# Patient Record
Sex: Male | Born: 1980 | Race: White | Hispanic: No | Marital: Married | Smoking: Never smoker
Health system: Southern US, Community
[De-identification: ages and names within clinical notes are randomized; demographics above are authoritative.]

## PROBLEM LIST (undated history)

## (undated) DIAGNOSIS — N529 Male erectile dysfunction, unspecified: Secondary | ICD-10-CM

## (undated) HISTORY — PX: TONSILLECTOMY AND ADENOIDECTOMY: SUR1326

## (undated) HISTORY — DX: Male erectile dysfunction, unspecified: N52.9

---

## 2010-07-22 ENCOUNTER — Ambulatory Visit: Payer: Self-pay | Admitting: Internal Medicine

## 2010-07-22 DIAGNOSIS — D239 Other benign neoplasm of skin, unspecified: Secondary | ICD-10-CM | POA: Insufficient documentation

## 2010-07-22 DIAGNOSIS — J309 Allergic rhinitis, unspecified: Secondary | ICD-10-CM | POA: Insufficient documentation

## 2010-07-22 DIAGNOSIS — N529 Male erectile dysfunction, unspecified: Secondary | ICD-10-CM | POA: Insufficient documentation

## 2010-07-22 DIAGNOSIS — F528 Other sexual dysfunction not due to a substance or known physiological condition: Secondary | ICD-10-CM

## 2010-07-23 LAB — CONVERTED CEMR LAB
BUN: 11 mg/dL (ref 6–23)
Calcium: 9.6 mg/dL (ref 8.4–10.5)
Cholesterol: 153 mg/dL (ref 0–200)
Creatinine, Ser: 1.1 mg/dL (ref 0.4–1.5)
GFR calc non Af Amer: 80.13 mL/min (ref >60)
HDL: 41.6 mg/dL (ref >39.00)
LDL Cholesterol: 103 mg/dL — ABNORMAL HIGH (ref 0–99)
TSH: 0.91 microintl units/mL (ref 0.35–5.50)
Total CHOL/HDL Ratio: 4
Triglycerides: 44 mg/dL (ref 0.0–149.0)
VLDL: 8.8 mg/dL (ref 0.0–40.0)

## 2010-08-23 ENCOUNTER — Ambulatory Visit: Payer: Self-pay | Admitting: Internal Medicine

## 2010-11-12 ENCOUNTER — Ambulatory Visit
Admission: RE | Admit: 2010-11-12 | Discharge: 2010-11-12 | Payer: Self-pay | Source: Home / Self Care | Attending: Family Medicine | Admitting: Family Medicine

## 2010-12-07 NOTE — Assessment & Plan Note (Signed)
Summary: ROA FOR 4-6 WEEK FOLLOW-UP/JRR   Vital Signs:  Patient profile:   30 year old male Weight:      198.75 pounds Temp:     97.8 degrees F oral Pulse rate:   64 / minute Pulse rhythm:   regular BP sitting:   110 / 62  (left arm) Cuff size:   large  Vitals Entered By: Selena Batten Dance CMA Duncan Dull) (August 23, 2010 8:15 AM) CC: Follow up   History of Present Illness: CC: f/u ED  using viagra for every single time.  Using 50mg  and doing well.  Had sex once without viagra, able to maintain erection but unable to ejaculate.  Now trying to taper down viagra, taking 25mg  and finding 1/3 times not sufficient.  Still able to have erection in AM.  No stress in relationship with wife.  No depression/anxiety, no decreased libido.  Energy level ok.   Allergies: No Known Drug Allergies  Past History:  Past medical, surgical, family and social histories (including risk factors) reviewed for relevance to current acute and chronic problems.  Past Medical History: Reviewed history from 07/22/2010 and no changes required. Allergic rhinitis ED?  Past Surgical History: Reviewed history from 07/22/2010 and no changes required. T&A as child  Family History: Reviewed history from 07/22/2010 and no changes required. PGM - D some CA MGM - D throat CA Maunt - D liver CA  No CAD/MI, CVA, no other CA (no prostate, colon, lung), DM, HTN, HLD  Social History: Reviewed history from 07/22/2010 and no changes required. 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   Viagra working well for him, will continue to try and taper off.  provided with refill.  RTC 2 month for f/u.  Doubt organic, likely psychogenic as evidenced by normal nocturnal erections.  States good relationship with wife, no depression, change in libido.  If not improved, consider further evaluation (ie T, relationship status, etc.)  RTC 1 mo for f/u  His updated medication list for this problem includes:    Viagra 100 Mg Tabs (Sildenafil citrate) .Marland Kitchen... Take one by mouth 30 min prior to relations  Complete Medication List: 1)  Fexofenadine Hcl 180 Mg Tabs (Fexofenadine hcl) .Marland Kitchen.. 1 by mouth once daily 2)  Rhinocort Aqua 32 Mcg/act Susp (Budesonide) .... 2 sprays in each nostril once daily 3)  Multivitamins Tabs (Multiple vitamin) .Marland Kitchen.. 1 by mouth once daily 4)  Viagra 100 Mg Tabs (Sildenafil citrate) .... Take one by mouth 30 min prior to relations  Patient Instructions: 1)  Let's continue this plan, refilled viagra. 2)  Return in 2-3 months for f/u.  Sooner if needed. 3)  Continue to use as needed, try to slowly back off if able.   Prescriptions: VIAGRA 100 MG TABS (SILDENAFIL CITRATE) take one by mouth 30 min prior to relations  #20 x 0   Entered and Authorized by:   Eustaquio Boyden  MD   Signed by:   Eustaquio Boyden  MD on 08/23/2010   Method used:   [facility]        [facility]  [address] [phone][facility] (retail)  [address]       [place], [place]  [address]       Ph: [phone] or [phone]       Fax: [phone]   RxID:   [id number]    Orders Added: 1)  Est. Patient Level III [87564]    Current Allergies (reviewed today): No known allergies

## 2010-12-07 NOTE — Assessment & Plan Note (Signed)
Summary: NEW PT TO EST/CPX/CLE   Vital Signs:  Patient profile:   30 year old male Height:      77 inches Weight:      196.50 pounds BMI:     23.39 Temp:     98.5 degrees F oral Pulse rate:   60 / minute Pulse rhythm:   regular BP sitting:   124 / 80  (left arm) Cuff size:   regular  Vitals Entered By: Selena Batten Dance CMA Duncan Dull) (July 22, 2010 9:36 AM) CC: New patient to establish care   History of Present Illness: CC: new patient.  Just had baby girl, Arna Medici.  05/2010.  ED - trouble maintaining erection x 6-8 mo.  Able to ejaculate with masturbation.  Feels desire still there, libido fine.  No issues with depression/anxiety.  No weight changes.  Able to wake up with firm erection in am.  Wife recently pregnant, didnt' have sex during pregnancy.  This week wife's OB released her to be able to have sex again.  Allergic rhinitis - takes allegra and rhinocort daily for seasonal allergies  would like spots checked on back.  Preventive Screening-Counseling & Management      Blood Transfusions:  no.    -  Date:  06/21/2010    TD booster Tdap  Current Medications (verified): 1)  Fexofenadine Hcl 180 Mg Tabs (Fexofenadine Hcl) .Marland Kitchen.. 1 By Mouth Once Daily 2)  Rhinocort Aqua 32 Mcg/act Susp (Budesonide) .... 2 Sprays in Each Nostril Once Daily 3)  Multivitamins  Tabs (Multiple Vitamin) .Marland Kitchen.. 1 By Mouth Once Daily  Allergies (verified): No Known Drug Allergies  Past History:  Past Medical History: Allergic rhinitis ED?  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 Blood Transfusions:  no  Review of Systems       The patient complains of suspicious skin  lesions.  The patient denies anorexia, fever, weight loss, weight gain, vision loss, decreased hearing, hoarseness, chest pain, syncope, dyspnea on exertion, peripheral edema, prolonged cough, headaches, hemoptysis, abdominal pain, melena, hematochezia, severe indigestion/heartburn, hematuria, incontinence, genital sores, muscle weakness, transient blindness, difficulty walking, depression, and testicular masses.         would like moles checked on back, ? vein on testicle, no testicular pain, no masses.  Physical Exam  General:  Well-developed,well-nourished,in no acute distress; alert,appropriate and cooperative throughout examination Head:  Normocephalic and atraumatic without obvious abnormalities. No apparent alopecia or balding. Eyes:  No corneal or conjunctival inflammation noted. EOMI. Perrla. Ears:  External ear exam shows no significant lesions or deformities.  Otoscopic examination reveals clear canals, tympanic membranes are intact bilaterally without bulging, retraction, inflammation or discharge. Hearing is grossly normal bilaterally. Nose:  External nasal examination shows no deformity or inflammation. Nasal mucosa are pink and moist without lesions or exudates. Mouth:  Oral mucosa and oropharynx without lesions or exudates.  Teeth in good repair. Neck:  No deformities, masses, or tenderness noted. Lungs:  Normal respiratory effort, chest expands symmetrically. Lungs are clear to auscultation, no crackles or wheezes. Heart:  Normal rate and regular rhythm. S1 and S2 normal without gallop, murmur, click, rub or other extra sounds. Abdomen:  Bowel sounds  positive,abdomen soft and non-tender without masses, organomegaly or hernias noted. Genitalia:  Testes bilaterally descended without nodularity, tenderness or masses. + Left varicocele.  scrotum WNL.  No penis lesions or urethral discharge.  circumcised Msk:  No deformity or scoliosis noted of thoracic or lumbar spine.   Pulses:  2+  rad pulses Extremities:  No clubbing, cyanosis, edema, or deformity noted with normal full range of motion of all joints.   Neurologic:  CN grossly intact, station and gait intact Skin:  Intact without suspicious lesions or rashes.  right shoulder with dark fleshy mole, R axilla with raised dark nevus Psych:  Cognition and judgment appear intact. Alert and cooperative with normal attention span and concentration. 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  trial of viagra to help regain confidence, then taper off.  Check TSH, FLP.  Discussed proper use of medications, as well as side effects.  If not improved, consider further evaluation (ie T, relationship status, etc.)  RTC 1 mo for f/u  Orders: TLB-Lipid Panel (80061-LIPID) TLB-BMP (Basic Metabolic Panel-BMET) (80048-METABOL) TLB-TSH (Thyroid Stimulating Hormone) (84443-TSH)  His updated medication list for this problem includes:    Viagra 100 Mg Tabs (Sildenafil citrate) .Marland Kitchen... Take one by mouth 30 min prior to relations  Problem # 2:  HEALTH MAINTENANCE EXAM (ICD-V70.0) Reviewed preventive care protocols, scheduled due services, and updated immunizations.  flu shot today.  Problem # 3:  ALLERGIC RHINITIS (ICD-477.9) seasonal.  good control with below. His updated medication list for this problem includes:    Fexofenadine Hcl 180 Mg Tabs (Fexofenadine hcl) .Marland Kitchen... 1 by mouth once daily    Rhinocort Aqua 32 Mcg/act Susp (Budesonide) .Marland Kitchen... 2 sprays in each nostril once daily  Problem # 4:  NEVUS (ICD-216.9) look ok today.  advised if changing to return for removal.  Complete Medication List: 1)  Fexofenadine Hcl 180 Mg Tabs (Fexofenadine hcl) .Marland Kitchen.. 1 by mouth once daily 2)  Rhinocort Aqua 32 Mcg/act Susp (Budesonide) .... 2 sprays in each nostril once  daily 3)  Multivitamins Tabs (Multiple vitamin) .Marland Kitchen.. 1 by mouth once daily 4)  Viagra 100 Mg Tabs (Sildenafil citrate) .... Take one by mouth 30 min prior to relations  Other Orders: Flu Vaccine 37yrs + ([address]109) Admin 1st Vaccine (60454)  Patient Instructions: 1)  Please return in 4-6 wks for follow up. 2)  Trial of viagra.  I don't think this problem is organic in nature.   3)  Blood work today to check cholesterol, sugars, and thyroid. 4)  Flu shot today. 5)  Pleasure to meet you, call clinic with questions. Prescriptions: VIAGRA 100 MG TABS (SILDENAFIL CITRATE) take one by mouth 30 min prior to relations  #10 x 0   Entered and Authorized by:   Eustaquio Boyden  MD   Signed by:   Eustaquio Boyden  MD on 07/22/2010   Method used:   [facility]        [facility]  [address] [id number]       [address]       [place], [place]  [address]       Ph: [phone] or [phone]       Fax: [phone]   RxID:   [id number]   Current Allergies (reviewed today): No known allergies    Prevention & Chronic Care Immunizations   Influenza vaccine: Fluvax 3+  (07/22/2010)  Influenza vaccine due: 07/09/2011    Tetanus booster: 06/21/2010: Tdap    Pneumococcal vaccine: Not documented  Other Screening   Smoking status: Not documented   Immunizations Administered:  Influenza Vaccine # 1:    Vaccine Type: Fluvax 3+    Site: left deltoid    Mfr: GlaxoSmithKline    Dose: 0.5 ml    Route: IM    Given by: Janee Morn CMA (AAMA)    Exp. Date: 05/07/2011    Lot #: [id number]    VIS given: 06/01/10 version given July 22, 2010.  Flu Vaccine Consent Questions:    Do you have a history of severe allergic reactions to this vaccine? no    Any prior history of allergic reactions to egg and/or gelatin? no    Do you have a sensitivity to the preservative Thimersol? no    Do you have a past history of Guillan-Barre Syndrome? no    Do you currently have an acute  febrile illness? no    Have you ever had a severe reaction to latex? no    Vaccine information given and explained to patient? yes

## 2010-12-09 NOTE — Assessment & Plan Note (Signed)
Summary: FOLLOW UP / LFW   Vital Signs:  Patient profile:   30 year old male Weight:      206.63 pounds Temp:     98.0 degrees F oral Pulse rate:   74 / minute Pulse rhythm:   regular BP sitting:   116 / 60  (left arm) Cuff size:   large  Vitals Entered By: Selena Batten Dance CMA (AAMA) (November 12, 2010 8:13 AM) CC: 3 month follow up   History of Present Illness: CC: f/u ED  last 2 months things have been better.  thinks that not thinking about issue has helped.  hasn't had that much chance to have sex but when does only needing 25mg  viagra and working 100% time (improvement from previous 2/3 of time).  activity wise - lifts weights 2-3 times/wk, walks dog 3x/day, 1 hour at night.  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  Past Medical History: Allergic rhinitis ED  Review of Systems       per HPI  Physical Exam  General:  Well-developed,well-nourished,in no acute distress; alert,appropriate and cooperative throughout examination Psych:  full affect, pleasant and cooperative.   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  provided with refill.  RTC 6 month for f/u.  Doubt organic, likely psychogenic as evidenced by normal nocturnal erections.  States good relationship with wife, no depression, change in libido.  If not improved, consider further evaluation (ie T, relationship status, uro)  His updated medication list for this problem includes:    Viagra 100 Mg Tabs (Sildenafil citrate) .Marland Kitchen... Take one by mouth 30 min prior to relations  Complete Medication List: 1)  Fexofenadine Hcl 180 Mg Tabs (Fexofenadine hcl) .Marland Kitchen.. 1 by mouth  once daily 2)  Rhinocort Aqua 32 Mcg/act Susp (Budesonide) .... 2 sprays in each nostril once daily 3)  Multivitamins Tabs (Multiple vitamin) .Marland Kitchen.. 1 by mouth once daily 4)  Viagra 100 Mg Tabs (Sildenafil citrate) .... Take one by mouth 30 min prior to relations  Patient Instructions: 1)  Continue with this plan. 2)  try to increase aerobic exercise. 3)  refilled viagra. 4)  Return in 6 months or as needed, sooner if not improving as expected.   5)  Good to see you today, call clinic with questions. Prescriptions: VIAGRA 100 MG TABS (SILDENAFIL CITRATE) take one by mouth 30 min prior to relations  #20 x 3   Entered and Authorized by:   Eustaquio Boyden  MD   Signed by:   Eustaquio Boyden  MD on 11/12/2010   Method used:   [facility]        [facility]  [address] [id number]       [address]       [place], [place]  [address]       Ph: [phone] or [phone]       Fax: [phone]   RxID:   [id number]    Orders Added: 1)  Est. Patient Level III [38756]    Current Allergies (reviewed today): No known allergies

## 2011-02-02 ENCOUNTER — Encounter: Payer: Self-pay | Admitting: Family Medicine

## 2011-04-29 ENCOUNTER — Other Ambulatory Visit: Payer: Self-pay | Admitting: *Deleted

## 2011-04-29 MED ORDER — SILDENAFIL CITRATE 100 MG PO TABS: ORAL_TABLET | ORAL | Status: DC

## 2011-04-29 NOTE — Telephone Encounter (Signed)
Sent in

## 2011-05-02 ENCOUNTER — Other Ambulatory Visit: Payer: Self-pay | Admitting: Family Medicine

## 2011-05-02 NOTE — Telephone Encounter (Signed)
Refilled two times until patient has cpx

## 2011-06-13 ENCOUNTER — Ambulatory Visit: Payer: Self-pay | Admitting: Family Medicine

## 2011-07-25 ENCOUNTER — Ambulatory Visit (INDEPENDENT_AMBULATORY_CARE_PROVIDER_SITE_OTHER): Payer: BC Managed Care – PPO | Admitting: Family Medicine

## 2011-07-25 ENCOUNTER — Encounter: Payer: Self-pay | Admitting: Family Medicine

## 2011-07-25 VITALS — BP 94/64 | HR 74 | Temp 98.5°F | Ht 77.0 in | Wt 209.0 lb

## 2011-07-25 DIAGNOSIS — Z0000 Encounter for general adult medical examination without abnormal findings: Secondary | ICD-10-CM

## 2011-07-25 DIAGNOSIS — J309 Allergic rhinitis, unspecified: Secondary | ICD-10-CM

## 2011-07-25 DIAGNOSIS — Z23 Encounter for immunization: Secondary | ICD-10-CM

## 2011-07-25 DIAGNOSIS — F528 Other sexual dysfunction not due to a substance or known physiological condition: Secondary | ICD-10-CM

## 2011-07-25 MED ORDER — SILDENAFIL CITRATE 100 MG PO TABS: ORAL_TABLET | ORAL | Status: DC

## 2011-07-25 MED ORDER — BUDESONIDE 32 MCG/ACT NA SUSP: 2.0000 | Freq: Every day | NASAL | Status: DC

## 2011-07-25 NOTE — Assessment & Plan Note (Signed)
Refilled meds per pt request 

## 2011-07-25 NOTE — Patient Instructions (Signed)
Return at your convenience for blood work. We will check testosterone level as well as other labs. I have refilled viagra and nasal steroid. Flu shot today. Good to see you today, call us with questions.

## 2011-07-25 NOTE — Assessment & Plan Note (Signed)
ED at young age - check testosterone. Controlled with viagra prn.

## 2011-07-25 NOTE — Assessment & Plan Note (Signed)
Discussed health lifestyle, diet. Reviewed preventative protocols and updated unless pt declined. Blood work today.

## 2011-07-25 NOTE — Progress Notes (Signed)
Subjective:    Patient ID: Colton Rivera, male    DOB: [birth year], 30 y.o.   MRN: [id number]  HPI CC: CPE  ED - still notes needing viagra at times.  No problems with fatigue, no problems with mood.  Libido ok.  No family history of testosterone issues.  Preventative: Td done 06/21/2010. Flu shot last year, would like today.  Medications and allergies reviewed and updated in chart.  Past histories reviewed and updated if relevant as below. Patient Active Problem List  Diagnoses  . NEVUS  . ERECTILE DYSFUNCTION, NON-ORGANIC  . ALLERGIC RHINITIS   Past Medical History  Diagnosis Date  . Allergic rhinitis   . ED (erectile dysfunction)    Past Surgical History  Procedure Date  . Tonsillectomy and adenoidectomy childhood   History  Substance Use Topics  . Smoking status: Never Smoker   . Smokeless tobacco: Never Used  . Alcohol Use: Yes     Socially   Family History  Problem Relation Age of Onset  . Cancer Paternal Grandmother     unknown kind  . Throat cancer Maternal Grandmother   . Liver cancer Maternal Aunt   . Cancer Maternal Aunt 65    colon  . Diabetes Maternal Uncle   . Stroke Neg Hx   . Coronary artery disease Neg Hx   . Hyperlipidemia Neg Hx    No Known Allergies Current Outpatient Prescriptions on File Prior to Visit  Medication Sig Dispense Refill  . fexofenadine (ALLEGRA) 180 MG tablet Take 180 mg by mouth daily.        . Multiple Vitamin (MULTIVITAMIN) tablet Take 1 tablet by mouth daily.         Review of Systems  Constitutional: Negative for fever, chills, activity change, appetite change, fatigue and unexpected weight change.  HENT: Negative for hearing loss and neck pain.   Eyes: Negative for visual disturbance.  Respiratory: Negative for cough, chest tightness, shortness of breath and wheezing.   Cardiovascular: Negative for chest pain, palpitations and leg swelling.  Gastrointestinal: Negative for nausea, vomiting, abdominal pain, diarrhea,  constipation, blood in stool and abdominal distention.  Genitourinary: Negative for hematuria and difficulty urinating.  Musculoskeletal: Negative for myalgias and arthralgias.  Skin: Negative for rash.  Neurological: Negative for dizziness, seizures, syncope and headaches.  Hematological: Does not bruise/bleed easily.  Psychiatric/Behavioral: Negative for dysphoric mood. The patient is not nervous/anxious.        Objective:   Physical Exam  Nursing note and vitals reviewed. Constitutional: He is oriented to person, place, and time. He appears well-developed and well-nourished. No distress.  HENT:  Head: Normocephalic and atraumatic.  Right Ear: External ear normal.  Left Ear: External ear normal.  Nose: Nose normal.  Mouth/Throat: Oropharynx is clear and moist.  Eyes: Conjunctivae and EOM are normal. Pupils are equal, round, and reactive to light.  Neck: Normal range of motion. Neck supple. No thyromegaly present.  Cardiovascular: Normal rate, regular rhythm, normal heart sounds and intact distal pulses.   No murmur heard. Pulses:      Radial pulses are 2+ on the right side, and 2+ on the left side.  Pulmonary/Chest: Effort normal and breath sounds normal. No respiratory distress. He has no wheezes. He has no rales.  Abdominal: Soft. Bowel sounds are normal. He exhibits no distension and no mass. There is no tenderness. There is no rebound and no guarding.  Musculoskeletal: Normal range of motion.  Lymphadenopathy:    He has no cervical adenopathy.  Neurological: He is alert and oriented to person, place, and time.       CN grossly intact, station and gait intact  Skin: Skin is warm and dry. No rash noted.  Psychiatric: He has a normal mood and affect. His behavior is normal. Judgment and thought content normal.          Assessment & Plan:

## 2011-07-26 ENCOUNTER — Other Ambulatory Visit (INDEPENDENT_AMBULATORY_CARE_PROVIDER_SITE_OTHER): Payer: BC Managed Care – PPO

## 2011-07-26 DIAGNOSIS — F528 Other sexual dysfunction not due to a substance or known physiological condition: Secondary | ICD-10-CM

## 2011-07-26 DIAGNOSIS — Z0000 Encounter for general adult medical examination without abnormal findings: Secondary | ICD-10-CM

## 2011-07-26 LAB — CBC WITH DIFFERENTIAL/PLATELET
Basophils Absolute: 0 10*3/uL (ref 0.0–0.1)
Basophils Relative: 0.6 % (ref 0.0–3.0)
Eosinophils Absolute: 0.2 10*3/uL (ref 0.0–0.7)
Lymphocytes Relative: 29.3 % (ref 12.0–46.0)
MCHC: 33.7 g/dL (ref 30.0–36.0)
Neutrophils Relative %: 57.7 % (ref 43.0–77.0)
Platelets: 202 10*3/uL (ref 150.0–400.0)
RBC: 4.92 Mil/uL (ref 4.22–5.81)

## 2011-07-26 LAB — COMPREHENSIVE METABOLIC PANEL
ALT: 17 U/L (ref 0–53)
Albumin: 4.5 g/dL (ref 3.5–5.2)
BUN: 12 mg/dL (ref 6–23)
CO2: 26 mEq/L (ref 19–32)
Calcium: 9 mg/dL (ref 8.4–10.5)
Chloride: 105 mEq/L (ref 96–112)
Creatinine, Ser: 1.1 mg/dL (ref 0.4–1.5)
GFR: 81.28 mL/min (ref >60.00)

## 2012-08-19 ENCOUNTER — Other Ambulatory Visit: Payer: Self-pay | Admitting: Family Medicine

## 2012-09-07 ENCOUNTER — Encounter: Payer: Self-pay | Admitting: Family Medicine

## 2012-09-07 ENCOUNTER — Ambulatory Visit (INDEPENDENT_AMBULATORY_CARE_PROVIDER_SITE_OTHER): Payer: BC Managed Care – PPO | Admitting: Family Medicine

## 2012-09-07 VITALS — BP 126/72 | HR 64 | Temp 98.5°F | Ht 77.0 in | Wt 207.8 lb

## 2012-09-07 DIAGNOSIS — Z0000 Encounter for general adult medical examination without abnormal findings: Secondary | ICD-10-CM

## 2012-09-07 DIAGNOSIS — Z23 Encounter for immunization: Secondary | ICD-10-CM

## 2012-09-07 DIAGNOSIS — J309 Allergic rhinitis, unspecified: Secondary | ICD-10-CM

## 2012-09-07 DIAGNOSIS — F528 Other sexual dysfunction not due to a substance or known physiological condition: Secondary | ICD-10-CM

## 2012-09-07 MED ORDER — SILDENAFIL CITRATE 100 MG PO TABS: ORAL_TABLET | ORAL | Status: DC

## 2012-09-07 MED ORDER — FLUTICASONE PROPIONATE 50 MCG/ACT NA SUSP: 2.0000 | Freq: Every day | NASAL | Status: DC

## 2012-09-07 NOTE — Assessment & Plan Note (Signed)
Trial of flonase 

## 2012-09-07 NOTE — Assessment & Plan Note (Addendum)
Preventative protocols reviewed and updated unless pt declined. Discussed healthy diet and lifestyle. Flu shot today. No need for blood work today.

## 2012-09-07 NOTE — Patient Instructions (Signed)
Flu shot today. I've refilled medicines. Good to see you today, call us with questions. No need for blood work today. Return in 1-2 years for next physical

## 2012-09-07 NOTE — Assessment & Plan Note (Signed)
Thought psychological, not necessarily organic.  Uses viagra prn. Continue prn use.

## 2012-09-07 NOTE — Progress Notes (Signed)
Subjective:    Patient ID: Colton Rivera, male    DOB: 04/17/1981, 31 y.o.   MRN: [id number]  HPI CC: CPE  ED - thought organic.  Intermittently needing viagra.  Hasn't used in a long time.  Has had normal w/u including CMP, CBC, TSH, and testosterone level.  Allergic rhinitis - asks about cheaper INS alternative.  Preventative:  Td done 06/21/2010.  Flu shot last year, would like today. Seat belt use discussed 100% Sunscreen use discussed.  Caffeine: rare soda Lives with wife, daughter (2011), dog Occupation: Radio broadcast assistant Activity: insanity.  tries to work out 3x/wk, walking dog, mixed martial arts Diet: eat out 1/wk, not too heavy with processed foods.  + fruits/vegetables.  Lots of water.  Medications and allergies reviewed and updated in chart.  Past histories reviewed and updated if relevant as below. Patient Active Problem List  Diagnosis  . ERECTILE DYSFUNCTION, NON-ORGANIC  . ALLERGIC RHINITIS  . Healthcare maintenance   Past Medical History  Diagnosis Date  . Allergic rhinitis   . ED (erectile dysfunction)    Past Surgical History  Procedure Date  . Tonsillectomy and adenoidectomy childhood   History  Substance Use Topics  . Smoking status: Never Smoker   . Smokeless tobacco: Never Used  . Alcohol Use: Yes     Socially   Family History  Problem Relation Age of Onset  . Cancer Paternal Grandmother     unknown kind  . Throat cancer Maternal Grandmother   . Liver cancer Maternal Aunt   . Cancer Maternal Aunt 65    colon  . Diabetes Maternal Uncle   . Stroke Neg Hx   . Coronary artery disease Neg Hx   . Hyperlipidemia Neg Hx    No Known Allergies Current Outpatient Prescriptions on File Prior to Visit  Medication Sig Dispense Refill  . fexofenadine (ALLEGRA) 180 MG tablet Take 180 mg by mouth daily.        . Multiple Vitamin (MULTIVITAMIN) tablet Take 1 tablet by mouth daily.        Marland Kitchen RHINOCORT AQUA 32 MCG/ACT nasal spray USE 2 SPRAYS IN EACH  NOSTRIL ONCE A DAY  8 g  11  . sildenafil (VIAGRA) 100 MG tablet Take one by mouth 30 mins prior to relations  20 tablet  3     Review of Systems  Constitutional: Negative for fever, chills, activity change, appetite change, fatigue and unexpected weight change.  HENT: Negative for hearing loss and neck pain.   Eyes: Negative for visual disturbance.  Respiratory: Negative for cough, chest tightness, shortness of breath and wheezing.   Cardiovascular: Negative for chest pain, palpitations and leg swelling.  Gastrointestinal: Negative for nausea, vomiting, abdominal pain, diarrhea, constipation, blood in stool and abdominal distention.  Genitourinary: Negative for hematuria and difficulty urinating.  Musculoskeletal: Negative for myalgias and arthralgias.  Skin: Negative for rash.  Neurological: Negative for dizziness, seizures, syncope and headaches.  Hematological: Does not bruise/bleed easily.  Psychiatric/Behavioral: Negative for dysphoric mood. The patient is not nervous/anxious.        Objective:   Physical Exam  Nursing note and vitals reviewed. Constitutional: He is oriented to person, place, and time. He appears well-developed and well-nourished. No distress.  HENT:  Head: Normocephalic and atraumatic.  Right Ear: Hearing, tympanic membrane, external ear and ear canal normal.  Left Ear: Hearing, tympanic membrane, external ear and ear canal normal.  Nose: Nose normal.  Mouth/Throat: Oropharynx is clear and moist. No oropharyngeal exudate.  Eyes: Conjunctivae normal and EOM are normal. Pupils are equal, round, and reactive to light. No scleral icterus.  Neck: Normal range of motion. Neck supple.  Cardiovascular: Normal rate, regular rhythm, normal heart sounds and intact distal pulses.   No murmur heard. Pulses:      Radial pulses are 2+ on the right side, and 2+ on the left side.  Pulmonary/Chest: Effort normal and breath sounds normal. No respiratory distress. He has no  wheezes. He has no rales.  Abdominal: Soft. Bowel sounds are normal. He exhibits no distension and no mass. There is no tenderness. There is no rebound and no guarding.  Musculoskeletal: Normal range of motion. He exhibits no edema.  Lymphadenopathy:    He has no cervical adenopathy.  Neurological: He is alert and oriented to person, place, and time.       CN grossly intact, station and gait intact  Skin: Skin is warm and dry. No rash noted.  Psychiatric: He has a normal mood and affect. His behavior is normal. Judgment and thought content normal.       Assessment & Plan:

## 2013-09-18 ENCOUNTER — Other Ambulatory Visit: Payer: Self-pay | Admitting: Family Medicine

## 2014-02-19 ENCOUNTER — Ambulatory Visit (INDEPENDENT_AMBULATORY_CARE_PROVIDER_SITE_OTHER): Payer: BC Managed Care – PPO | Admitting: Family Medicine

## 2014-02-19 ENCOUNTER — Encounter: Payer: Self-pay | Admitting: Family Medicine

## 2014-02-19 VITALS — BP 126/82 | HR 60 | Temp 98.1°F | Wt 200.2 lb

## 2014-02-19 DIAGNOSIS — N400 Enlarged prostate without lower urinary tract symptoms: Secondary | ICD-10-CM | POA: Insufficient documentation

## 2014-02-19 DIAGNOSIS — R197 Diarrhea, unspecified: Secondary | ICD-10-CM

## 2014-02-19 LAB — COMPREHENSIVE METABOLIC PANEL
ALBUMIN: 4.4 g/dL (ref 3.5–5.2)
ALT: 16 U/L (ref 0–53)
AST: 19 U/L (ref 0–37)
Alkaline Phosphatase: 51 U/L (ref 39–117)
BUN: 13 mg/dL (ref 6–23)
CHLORIDE: 104 meq/L (ref 96–112)
CO2: 30 meq/L (ref 19–32)
Calcium: 9.3 mg/dL (ref 8.4–10.5)
Creatinine, Ser: 1.2 mg/dL (ref 0.4–1.5)
GFR: 76.07 mL/min (ref >60.00)
Glucose, Bld: 84 mg/dL (ref 70–99)
Potassium: 4.7 mEq/L (ref 3.5–5.1)
Sodium: 139 mEq/L (ref 135–145)
Total Bilirubin: 0.8 mg/dL (ref 0.3–1.2)
Total Protein: 7.1 g/dL (ref 6.0–8.3)

## 2014-02-19 LAB — CBC WITH DIFFERENTIAL/PLATELET
BASOS ABS: 0 10*3/uL (ref 0.0–0.1)
BASOS PCT: 0.8 % (ref 0.0–3.0)
EOS ABS: 0.6 10*3/uL (ref 0.0–0.7)
Eosinophils Relative: 10.8 % — ABNORMAL HIGH (ref 0.0–5.0)
HCT: 46.9 % (ref 39.0–52.0)
HEMOGLOBIN: 16 g/dL (ref 13.0–17.0)
LYMPHS PCT: 24.2 % (ref 12.0–46.0)
Lymphs Abs: 1.4 10*3/uL (ref 0.7–4.0)
MCHC: 34.1 g/dL (ref 30.0–36.0)
MCV: 91.4 fl (ref 78.0–100.0)
MONOS PCT: 6.9 % (ref 3.0–12.0)
Monocytes Absolute: 0.4 10*3/uL (ref 0.1–1.0)
NEUTROS PCT: 57.3 % (ref 43.0–77.0)
Neutro Abs: 3.4 10*3/uL (ref 1.4–7.7)
Platelets: 236 10*3/uL (ref 150.0–400.0)
RBC: 5.13 Mil/uL (ref 4.22–5.81)
RDW: 13 % (ref 11.5–14.6)
WBC: 5.9 10*3/uL (ref 4.5–10.5)

## 2014-02-19 LAB — HIGH SENSITIVITY CRP: CRP, High Sensitivity: 1.42 mg/L (ref 0.000–5.000)

## 2014-02-19 MED ORDER — ONDANSETRON HCL 4 MG PO TABS: 4.0000 mg | ORAL_TABLET | Freq: Three times a day (TID) | ORAL | Status: DC | PRN

## 2014-02-19 NOTE — Progress Notes (Signed)
Pre visit review using our clinic review tool, if applicable. No additional management support is needed unless otherwise documented below in the visit note. 

## 2014-02-19 NOTE — Progress Notes (Signed)
BP 126/82  Pulse 60  Temp(Src) 98.1 F (36.7 C) (Oral)  Wt 200 lb 4 oz (90.833 kg)   CC: abd discomfort  Subjective:    Patient ID: Colton Rivera, male    DOB: February 15, 1981, 33 y.o.   MRN: [id number]  HPI: Colton Rivera is a 33 y.o. male presenting on 02/19/2014 for Abdominal Pain   1.5 wk h/o abdominal discomfort. Feels ok in am, then starts feeling worse as day goes on that lasts several hours.  Endorses lower and upper abdominal discomfort/cramping,without radiation.  2-3/10 at worst.  Hasn't found any relation to food.  Ibuprofen/pepto bismol hasn't helped.  Not improved after BM.  Has been having loose bowels for last week and a half.  Then has turned into watery diarrhea for last 4 days.  Some nausea.  No increase in stress.  To start new job today.  Some bright red blood in stool with wiping.  No h/o hemorrhoids.  Some tenesmus.  No h/o lactose intolerance.   No fevers/chills, gerd sxs, constipation, vomiting.  Appetite ok.  No urinary changes or dysuria.   No weight changes with this. Wt Readings from Last 3 Encounters:  02/19/14 200 lb 4 oz (90.833 kg)  09/07/12 207 lb 12 oz (94.235 kg)  07/25/11 209 lb 0.6 oz (94.82 kg)    No recent travel outside of country.  Uses city water.  No recent suspicious meals.  No other sick contacts at home.  Seen at [facility] - treated with bentyl but this may have made sxs worse - more nausea and diarrhea.  ?IBS or stress related.  Relevant past medical, surgical, family and social history reviewed and updated as indicated.  Allergies and medications reviewed and updated. Current Outpatient Prescriptions on File Prior to Visit  Medication Sig  . fexofenadine (ALLEGRA) 180 MG tablet Take 180 mg by mouth daily.    . fluticasone (FLONASE) 50 MCG/ACT nasal spray USE 2 SPRAYS IN EACH NOSTRIL DAILY  . Multiple Vitamin (MULTIVITAMIN) tablet Take 1 tablet by mouth daily.    Marland Kitchen VIAGRA 100 MG tablet TAKE ONE BY MOUTH 30 MINS PRIOR TO RELATIONS   No current  facility-administered medications on file prior to visit.    Review of Systems Per HPI unless specifically indicated above    Objective:    BP 126/82  Pulse 60  Temp(Src) 98.1 F (36.7 C) (Oral)  Wt 200 lb 4 oz (90.833 kg)  Physical Exam  Nursing note and vitals reviewed. Constitutional: He appears well-developed and well-nourished. No distress.  HENT:  Mouth/Throat: Oropharynx is clear and moist. No oropharyngeal exudate.  Cardiovascular: Normal rate, regular rhythm, normal heart sounds and intact distal pulses.   No murmur heard. Pulmonary/Chest: Effort normal and breath sounds normal. No respiratory distress. He has no wheezes. He has no rales.  Abdominal: Soft. Normal appearance and bowel sounds are normal. He exhibits no distension and no mass. There is no hepatosplenomegaly. There is no tenderness. There is no rigidity, no rebound, no guarding, no CVA tenderness and negative Murphy's sign.  Genitourinary: Rectal exam shows no external hemorrhoid, no internal hemorrhoid, no fissure, no mass, no tenderness and anal tone normal. Guaiac positive stool. Prostate is enlarged (25gm). Prostate is not tender.  Musculoskeletal: He exhibits no edema.  Skin: Skin is warm and dry.       Assessment & Plan:   Problem List Items Addressed This Visit   Diarrhea - Primary     1.5 wk h/o bloody diarrhea with  abdominal discomfort with hemoccult + DRE today. Check CRP, CBC and stool cultures today along with C diff. Treatment pending results.  Avoid abx if E coli  Suggested start probiotic.    Relevant Orders      CBC with Differential      Comprehensive metabolic panel      High sensitivity CRP      Clostridium difficile EIA      Stool culture   BPH (benign prostatic hypertrophy)     Noted on exam today.        Follow up plan: Return if symptoms worsen or fail to improve.

## 2014-02-19 NOTE — Assessment & Plan Note (Signed)
Noted on exam today.  

## 2014-02-19 NOTE — Patient Instructions (Signed)
This is most consistent with infectious diarrhea. Check blood work and stool studies today. Push fluids and rest. Use zofran for nausea as needed. May try phillips colon health probiotics as well. Hopefully should get better on its own.  Let us know if any worsening pain or symptoms.

## 2014-02-19 NOTE — Assessment & Plan Note (Addendum)
1.5 wk h/o bloody diarrhea with abdominal discomfort with hemoccult + DRE today. Check CRP, CBC and stool cultures today along with C diff. Treatment pending results.  Avoid abx if E coli  Suggested start probiotic.

## 2014-02-20 LAB — CLOSTRIDIUM DIFFICILE EIA: CDIFTX: NEGATIVE

## 2014-02-21 ENCOUNTER — Telehealth: Payer: Self-pay

## 2014-02-21 NOTE — Telephone Encounter (Signed)
Pt left v/m requesting cb when stool testing results are available.

## 2014-02-21 NOTE — Telephone Encounter (Signed)
Notify pt.  I had checked that.  C diff neg, stool cultures pending. I was awaiting the final report to call pt.

## 2014-02-21 NOTE — Telephone Encounter (Signed)
Left detailed message on voicemail.  

## 2014-02-23 LAB — STOOL CULTURE

## 2014-04-09 ENCOUNTER — Ambulatory Visit (INDEPENDENT_AMBULATORY_CARE_PROVIDER_SITE_OTHER): Payer: Managed Care, Other (non HMO) | Admitting: Family Medicine

## 2014-04-09 ENCOUNTER — Encounter: Payer: Self-pay | Admitting: Family Medicine

## 2014-04-09 VITALS — BP 132/78 | HR 64 | Temp 98.1°F | Wt 198.8 lb

## 2014-04-09 DIAGNOSIS — R109 Unspecified abdominal pain: Secondary | ICD-10-CM

## 2014-04-09 MED ORDER — CILIDINIUM-CHLORDIAZEPOXIDE 2.5-5 MG PO CAPS: 1.0000 | ORAL_CAPSULE | Freq: Three times a day (TID) | ORAL | Status: DC | PRN

## 2014-04-09 NOTE — Progress Notes (Signed)
Pre visit review using our clinic review tool, if applicable. No additional management support is needed unless otherwise documented below in the visit note. 

## 2014-04-09 NOTE — Assessment & Plan Note (Signed)
abd discomfort described as ache associated with bowel changes, after recent presumed infectious diarrhea. ?development of IBS although not typical for this. Recent blood work and stool studies unrevealing. Will do trial of librax prn for next 1-2 wks, if persistent discomfort or not responsive to this will refer to GI for further evaluation. Pt agrees with plan.

## 2014-04-09 NOTE — Progress Notes (Signed)
BP 132/78  Pulse 64  Temp(Src) 98.1 F (36.7 C) (Oral)  Wt 198 lb 12 oz (90.152 kg)   CC: f/u stomach issues  Subjective:    Patient ID: Colton Rivera, male    DOB: April 25, 1981, 33 y.o.   MRN: [id number]  HPI: Brion Sossamon is a 33 y.o. male presenting on 04/09/2014 for Abdominal Pain   See prior note for details. Seen here 02/2014 with 1.5 wk h/o bloody diarrhea with abdominal discomfort with hemoccult + DRE on exam.  A few days after he was seen here started feeling better. At that time, labwork (CMP, CRP, CBC and stool cultures along with C diff) returned normal.   Now with 2 week h/o recurrent sxs although not as severe, started noticing persistent ache in upper and lower abdomen as well as looser stools.  sxs usually better in am but tend to get worse in afternoon and evenings. Hasn't found inciting food trigger. Feels worse with pressure on lower abdomen (ie tight belt) as well as if he eats larger meal. No relief after bowel movement. Some stool urgency. Has not noticed any more blood in stool.   Recommended probiotic which initially did help but no longer. Started new job back in April, going well. No significant stressors. Pt did not respond to trial of bentyl (by [facility]) 2/2 nausea.   Since GI issues, trying to eat healthy as well as avoiding fast foods.  No fevers, vomiting, urinary sxs (dysuria, urgency).  Relevant past medical, surgical, family and social history reviewed and updated as indicated.  Allergies and medications reviewed and updated. Current Outpatient Prescriptions on File Prior to Visit  Medication Sig  . fexofenadine (ALLEGRA) 180 MG tablet Take 180 mg by mouth daily.    . fluticasone (FLONASE) 50 MCG/ACT nasal spray USE 2 SPRAYS IN EACH NOSTRIL DAILY  . Multiple Vitamin (MULTIVITAMIN) tablet Take 1 tablet by mouth daily.    . ondansetron (ZOFRAN) 4 MG tablet Take 1 tablet (4 mg total) by mouth every 8 (eight) hours as needed for nausea or vomiting.  Marland Kitchen VIAGRA  100 MG tablet TAKE ONE BY MOUTH 30 MINS PRIOR TO RELATIONS   No current facility-administered medications on file prior to visit.    Review of Systems Per HPI unless specifically indicated above    Objective:    BP 132/78  Pulse 64  Temp(Src) 98.1 F (36.7 C) (Oral)  Wt 198 lb 12 oz (90.152 kg)  Physical Exam  Nursing note and vitals reviewed. Constitutional: He appears well-developed and well-nourished. No distress.  Abdominal: Soft. Normal appearance and bowel sounds are normal. He exhibits no distension and no mass. There is no hepatosplenomegaly. There is no tenderness. There is no rigidity, no rebound, no guarding, no CVA tenderness and negative Murphy's sign. No hernia.  Musculoskeletal: He exhibits no edema.  Skin: Skin is warm and dry. No rash noted.   Results for orders placed in visit on 02/19/14  CLOSTRIDIUM DIFFICILE EIA      Result Value Ref Range   CDIFTX NEGATIVE    STOOL CULTURE      Result Value Ref Range   Organism ID, Bacteria No Salmonella,Shigella,Campylobacter,Yersinia,or     Organism ID, Bacteria No E.coli [id number] isolated.    CBC WITH DIFFERENTIAL      Result Value Ref Range   WBC 5.9  4.5 - 10.5 K/uL   RBC 5.13  4.22 - 5.81 Mil/uL   Hemoglobin 16.0  13.0 - 17.0 g/dL   HCT  46.9  39.0 - 52.0 %   MCV 91.4  78.0 - 100.0 fl   MCHC 34.1  30.0 - 36.0 g/dL   RDW 13.0  11.5 - 14.6 %   Platelets 236.0  150.0 - 400.0 K/uL   Neutrophils Relative % 57.3  43.0 - 77.0 %   Lymphocytes Relative 24.2  12.0 - 46.0 %   Monocytes Relative 6.9  3.0 - 12.0 %   Eosinophils Relative 10.8 (*) 0.0 - 5.0 %   Basophils Relative 0.8  0.0 - 3.0 %   Neutro Abs 3.4  1.4 - 7.7 K/uL   Lymphs Abs 1.4  0.7 - 4.0 K/uL   Monocytes Absolute 0.4  0.1 - 1.0 K/uL   Eosinophils Absolute 0.6  0.0 - 0.7 K/uL   Basophils Absolute 0.0  0.0 - 0.1 K/uL  COMPREHENSIVE METABOLIC PANEL      Result Value Ref Range   Sodium 139  135 - 145 mEq/L   Potassium 4.7  3.5 - 5.1 mEq/L   Chloride 104   96 - 112 mEq/L   CO2 30  19 - 32 mEq/L   Glucose, Bld 84  70 - 99 mg/dL   BUN 13  6 - 23 mg/dL   Creatinine, Ser 1.2  0.4 - 1.5 mg/dL   Total Bilirubin 0.8  0.3 - 1.2 mg/dL   Alkaline Phosphatase 51  39 - 117 U/L   AST 19  0 - 37 U/L   ALT 16  0 - 53 U/L   Total Protein 7.1  6.0 - 8.3 g/dL   Albumin 4.4  3.5 - 5.2 g/dL   Calcium 9.3  8.4 - 10.5 mg/dL   GFR 76.07  >60.00 mL/min  HIGH SENSITIVITY CRP      Result Value Ref Range   CRP, High Sensitivity 1.420  0.000 - 5.000 mg/L      Assessment & Plan:   Problem List Items Addressed This Visit   Abdominal discomfort - Primary     abd discomfort described as ache associated with bowel changes, after recent presumed infectious diarrhea. ?development of IBS although not typical for this. Recent blood work and stool studies unrevealing. Will do trial of librax prn for next 1-2 wks, if persistent discomfort or not responsive to this will refer to GI for further evaluation. Pt agrees with plan.        Follow up plan: No Follow-up on file.

## 2014-04-09 NOTE — Patient Instructions (Signed)
I wonder about irritable bowels- trial of librax with meals as antispasmodic for bowels. Caution it can make you sedated. If not improving with this, let us know for referral to GI. Good to see you today, call us with questions

## 2015-07-24 ENCOUNTER — Other Ambulatory Visit: Payer: Self-pay | Admitting: Orthopedic Surgery

## 2015-07-24 DIAGNOSIS — M546 Pain in thoracic spine: Secondary | ICD-10-CM

## 2015-07-31 ENCOUNTER — Ambulatory Visit
Admission: RE | Admit: 2015-07-31 | Discharge: 2015-07-31 | Disposition: A | Discharge status: Home / Self Care | Payer: Managed Care, Other (non HMO) | Source: Ambulatory Visit | Attending: Orthopedic Surgery | Admitting: Orthopedic Surgery

## 2015-07-31 DIAGNOSIS — M5124 Other intervertebral disc displacement, thoracic region: Secondary | ICD-10-CM | POA: Insufficient documentation

## 2015-07-31 DIAGNOSIS — M546 Pain in thoracic spine: Secondary | ICD-10-CM | POA: Diagnosis present

## 2015-07-31 DIAGNOSIS — M5134 Other intervertebral disc degeneration, thoracic region: Secondary | ICD-10-CM | POA: Insufficient documentation

## 2018-02-14 ENCOUNTER — Telehealth: Payer: Self-pay | Admitting: Family Medicine

## 2018-02-14 NOTE — Telephone Encounter (Signed)
See below crm  Ok to schedule?  Copied from [place] [id number]. Topic: Appointment Scheduling - Scheduling Inquiry for Clinic >> Feb 13, 2018 10:37 AM Synthia Innocent wrote: Reason for CRM: Patient last seen in 2015 by Dr Danise Mina, patient is requesting to reestablish with Dr Danise Mina. Please advise

## 2018-02-14 NOTE — Telephone Encounter (Signed)
Left message asking pt to call office  °

## 2018-02-14 NOTE — Telephone Encounter (Signed)
Yes plz schedule. Thank you!

## 2018-02-27 ENCOUNTER — Encounter: Payer: Self-pay | Admitting: Family Medicine

## 2018-02-27 ENCOUNTER — Ambulatory Visit: Payer: Managed Care, Other (non HMO) | Admitting: Family Medicine

## 2018-02-27 VITALS — BP 118/68 | HR 61 | Temp 97.8°F | Ht 76.0 in | Wt 208.2 lb

## 2018-02-27 DIAGNOSIS — Z0000 Encounter for general adult medical examination without abnormal findings: Secondary | ICD-10-CM

## 2018-02-27 DIAGNOSIS — Z13220 Encounter for screening for lipoid disorders: Secondary | ICD-10-CM

## 2018-02-27 DIAGNOSIS — M4200 Juvenile osteochondrosis of spine, site unspecified: Secondary | ICD-10-CM

## 2018-02-27 DIAGNOSIS — Z131 Encounter for screening for diabetes mellitus: Secondary | ICD-10-CM

## 2018-02-27 DIAGNOSIS — F528 Other sexual dysfunction not due to a substance or known physiological condition: Secondary | ICD-10-CM

## 2018-02-27 MED ORDER — METHOCARBAMOL 500 MG PO TABS: 500.0000 mg | ORAL_TABLET | Freq: Three times a day (TID) | ORAL | 1 refills | Status: DC | PRN

## 2018-02-27 MED ORDER — SILDENAFIL CITRATE 100 MG PO TABS: 50.0000 mg | ORAL_TABLET | ORAL | 3 refills | Status: DC | PRN

## 2018-02-27 NOTE — Patient Instructions (Addendum)
Return at your convenience for fasting labs.  Try muscle relaxant to use as needed for thoracic muscle stiffness. Rhomboid exercises provided today.  We will set you up with sports medicine for evaluation.   Health Maintenance, Male A healthy lifestyle and preventive care is important for your health and wellness. Ask your health care provider about what schedule of regular examinations is right for you. What should I know about weight and diet? Eat a Healthy Diet  Eat plenty of vegetables, fruits, whole grains, low-fat dairy products, and lean protein.  Do not eat a lot of foods high in solid fats, added sugars, or salt.  Maintain a Healthy Weight Regular exercise can help you achieve or maintain a healthy weight. You should:  Do at least 150 minutes of exercise each week. The exercise should increase your heart rate and make you sweat (moderate-intensity exercise).  Do strength-training exercises at least twice a week.  Watch Your Levels of Cholesterol and Blood Lipids  Have your blood tested for lipids and cholesterol every 5 years starting at 39 years of age. If you are at high risk for heart disease, you should start having your blood tested when you are 39 years old. You may need to have your cholesterol levels checked more often if: ? Your lipid or cholesterol levels are high. ? You are older than 39 years of age. ? You are at high risk for heart disease.  What should I know about cancer screening? Many types of cancers can be detected early and may often be prevented. Lung Cancer  You should be screened every year for lung cancer if: ? You are a current smoker who has smoked for at least 30 years. ? You are a former smoker who has quit within the past 15 years.  Talk to your health care provider about your screening options, when you should start screening, and how often you should be screened.  Colorectal Cancer  Routine colorectal cancer screening usually begins at 39  years of age and should be repeated every 5-10 years until you are 39 years old. You may need to be screened more often if early forms of precancerous polyps or small growths are found. Your health care provider may recommend screening at an earlier age if you have risk factors for colon cancer.  Your health care provider may recommend using home test kits to check for hidden blood in the stool.  A small camera at the end of a tube can be used to examine your colon (sigmoidoscopy or colonoscopy). This checks for the earliest forms of colorectal cancer.  Prostate and Testicular Cancer  Depending on your age and overall health, your health care provider may do certain tests to screen for prostate and testicular cancer.  Talk to your health care provider about any symptoms or concerns you have about testicular or prostate cancer.  Skin Cancer  Check your skin from head to toe regularly.  Tell your health care provider about any new moles or changes in moles, especially if: ? There is a change in a mole's size, shape, or color. ? You have a mole that is larger than a pencil eraser.  Always use sunscreen. Apply sunscreen liberally and repeat throughout the day.  Protect yourself by wearing long sleeves, pants, a wide-brimmed hat, and sunglasses when outside.  What should I know about heart disease, diabetes, and high blood pressure?  If you are 82-59 years of age, have your blood pressure checked every 3-5  years. If you are 89 years of age or older, have your blood pressure checked every year. You should have your blood pressure measured twice-once when you are at a hospital or clinic, and once when you are not at a hospital or clinic. Record the average of the two measurements. To check your blood pressure when you are not at a hospital or clinic, you can use: ? An automated blood pressure machine at a pharmacy. ? A home blood pressure monitor.  Talk to your health care provider about your  target blood pressure.  If you are between 80-22 years old, ask your health care provider if you should take aspirin to prevent heart disease.  Have regular diabetes screenings by checking your fasting blood sugar level. ? If you are at a normal weight and have a low risk for diabetes, have this test once every three years after the age of 71. ? If you are overweight and have a high risk for diabetes, consider being tested at a younger age or more often.  A one-time screening for abdominal aortic aneurysm (AAA) by ultrasound is recommended for men aged 24-75 years who are current or former smokers. What should I know about preventing infection? Hepatitis B If you have a higher risk for hepatitis B, you should be screened for this virus. Talk with your health care provider to find out if you are at risk for hepatitis B infection. Hepatitis C Blood testing is recommended for:  Everyone born from [birth year] through 1967.  Anyone with known risk factors for hepatitis C.  Sexually Transmitted Diseases (STDs)  You should be screened each year for STDs including gonorrhea and chlamydia if: ? You are sexually active and are younger than 39 years of age. ? You are older than 39 years of age and your health care provider tells you that you are at risk for this type of infection. ? Your sexual activity has changed since you were last screened and you are at an increased risk for chlamydia or gonorrhea. Ask your health care provider if you are at risk.  Talk with your health care provider about whether you are at high risk of being infected with HIV. Your health care provider may recommend a prescription medicine to help prevent HIV infection.  What else can I do?  Schedule regular health, dental, and eye exams.  Stay current with your vaccines (immunizations).  Do not use any tobacco products, such as cigarettes, chewing tobacco, and e-cigarettes. If you need help quitting, ask your health care  provider.  Limit alcohol intake to no more than 2 drinks per day. One drink equals 12 ounces of beer, 5 ounces of wine, or 1 ounces of hard liquor.  Do not use street drugs.  Do not share needles.  Ask your health care provider for help if you need support or information about quitting drugs.  Tell your health care provider if you often feel depressed.  Tell your health care provider if you have ever been abused or do not feel safe at home. This information is not intended to replace advice given to you by your health care provider. Make sure you discuss any questions you have with your health care provider. Document Released: 04/21/2008 Document Revised: 06/22/2016 Document Reviewed: 07/28/2015 Elsevier Interactive Patient Education  [employer].

## 2018-02-27 NOTE — Assessment & Plan Note (Addendum)
viagra refilled.  

## 2018-02-27 NOTE — Assessment & Plan Note (Signed)
With ongoing L thoracic discomfort. Has tried several remedies to date including ortho eval, PT course, massage, chiropractor, NSAIDs, heating pad. Finds exercise and daily stretching helps, as well as heating pad.  Provided with robaxin muscle relaxant course as well as rhomboid stretches today. Will refer to sports med for further eval, recs on focused exercise regimen for this. Pt agrees with plan.

## 2018-02-27 NOTE — Progress Notes (Signed)
BP 118/68 (BP Location: Left Arm, Patient Position: Sitting, Cuff Size: Normal)   Pulse 61   Temp 97.8 F (36.6 C) (Oral)   Ht 6\' 4"  (1.93 m)   Wt 208 lb 4 oz (94.5 kg)   SpO2 99%   BMI 25.35 kg/m    CC: re establish care Subjective:    Patient ID: Colton Rivera, male    DOB: 26-Apr-1981, 37 y.o.   MRN: [id number]  HPI: Colton Rivera is a 37 y.o. male presenting on 02/27/2018 for New patient (Needs to re-establish.)   Last seen 2015.   Ongoing upper back pain between shoulder blades since 2015. Has seen orthopedist at [facility], completed PT without benefit, had MRI (showing bulging discs and scheuermann's disease (kyphosis) - reviewed in chart). Has tried chiropractor, massage (saw integrative therapy). All help temporarily. Ongoing discomfort. Exercise helps. Stretching regularly. No shooting pain down legs or arms, no numbness/weakness down arms or legs. Heating pad also helps.   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 Feels he has good ergonomics at work station.   ED - viagra PRN.  Not fasting - bowl of cereal this morning.   Preventative: Influeza yearly Td 2011 Seat belt use discussed Sunscreen use discussed.  Non smoker Alcohol - 1-2 drinks a week  Lives with wife, daughter (2011), dog Occupation: Software engineer Activity: weight lifting, jiu jitsu, daily stretching  Diet: daily fruits/vegetables. Good water.   Relevant past medical, surgical, family and social history reviewed and updated as indicated. Interim medical history since our last visit reviewed. Allergies and medications reviewed and updated. Outpatient Medications Prior to Visit  Medication Sig Dispense Refill  . fexofenadine (ALLEGRA) 180 MG tablet Take 180 mg by mouth daily.      . fluticasone (FLONASE) 50 MCG/ACT nasal spray USE 2 SPRAYS IN EACH NOSTRIL DAILY 16 g 3  . Multiple Vitamin (MULTIVITAMIN) tablet Take 1 tablet by mouth daily.      Marland Kitchen VIAGRA 100 MG tablet TAKE ONE BY MOUTH 30 MINS PRIOR TO  RELATIONS 20 tablet 2  . clidinium-chlordiazePOXIDE (LIBRAX) 5-2.5 MG per capsule Take 1 capsule by mouth 3 (three) times daily as needed (with meals (sedation precautions)). 60 capsule 1  . ondansetron (ZOFRAN) 4 MG tablet Take 1 tablet (4 mg total) by mouth every 8 (eight) hours as needed for nausea or vomiting. 20 tablet 0   No facility-administered medications prior to visit.      Per HPI unless specifically indicated in ROS section below Review of Systems  Constitutional: Negative for activity change, appetite change, chills, fatigue, fever and unexpected weight change.  HENT: Negative for hearing loss.   Eyes: Negative for visual disturbance.  Respiratory: Negative for cough, chest tightness, shortness of breath and wheezing.   Cardiovascular: Negative for chest pain, palpitations and leg swelling.  Gastrointestinal: Negative for abdominal distention, abdominal pain, blood in stool, constipation, diarrhea, nausea and vomiting.  Genitourinary: Negative for difficulty urinating and hematuria.  Musculoskeletal: Positive for back pain. Negative for arthralgias, myalgias and neck pain.  Skin: Negative for rash.  Neurological: Negative for dizziness, seizures, syncope and headaches.  Hematological: Negative for adenopathy. Does not bruise/bleed easily.  Psychiatric/Behavioral: Negative for dysphoric mood. The patient is not nervous/anxious.        Objective:    BP 118/68 (BP Location: Left Arm, Patient Position: Sitting, Cuff Size: Normal)   Pulse 61   Temp 97.8 F (36.6 C) (Oral)   Ht 6\' 4"  (1.93 m)   Wt  208 lb 4 oz (94.5 kg)   SpO2 99%   BMI 25.35 kg/m   Wt Readings from Last 3 Encounters:  02/27/18 208 lb 4 oz (94.5 kg)  07/31/15 198 lb (89.8 kg)  04/09/14 198 lb 12 oz (90.2 kg)    Physical Exam  Constitutional: He is oriented to person, place, and time. He appears well-developed and well-nourished. No distress.  HENT:  Head: Normocephalic and atraumatic.  Right Ear:  Hearing, tympanic membrane, external ear and ear canal normal.  Left Ear: Hearing, tympanic membrane, external ear and ear canal normal.  Nose: Nose normal.  Mouth/Throat: Uvula is midline, oropharynx is clear and moist and mucous membranes are normal. No oropharyngeal exudate, posterior oropharyngeal edema or posterior oropharyngeal erythema.  Eyes: Pupils are equal, round, and reactive to light. Conjunctivae and EOM are normal. No scleral icterus.  Neck: Normal range of motion. Neck supple. No thyromegaly present.  Cardiovascular: Normal rate, regular rhythm, normal heart sounds and intact distal pulses.  No murmur heard. Pulses:      Radial pulses are 2+ on the right side, and 2+ on the left side.  Pulmonary/Chest: Effort normal and breath sounds normal. No respiratory distress. He has no wheezes. He has no rales.  Abdominal: Soft. Bowel sounds are normal. He exhibits no distension and no mass. There is no tenderness. There is no rebound and no guarding.  Musculoskeletal: Normal range of motion. He exhibits no edema.  No pain midline cervical, thoracic, or lumbar spine No paraspinous mm tenderness No thoracic scoliosis Thoracic kyphosis.   Lymphadenopathy:    He has no cervical adenopathy.  Neurological: He is alert and oriented to person, place, and time.  CN grossly intact, station and gait intact  Skin: Skin is warm and dry. No rash noted.  Psychiatric: He has a normal mood and affect. His behavior is normal. Judgment and thought content normal.  Nursing note and vitals reviewed.      Assessment & Plan:   Problem List Items Addressed This Visit    ERECTILE DYSFUNCTION, NON-ORGANIC    viagra refilled.       Healthcare maintenance - Primary    Preventative protocols reviewed and updated unless pt declined. Discussed healthy diet and lifestyle.       Scheuermann's kyphosis    With ongoing L thoracic discomfort. Has tried several remedies to date including ortho eval, PT  course, massage, chiropractor, NSAIDs, heating pad. Finds exercise and daily stretching helps, as well as heating pad.  Provided with robaxin muscle relaxant course as well as rhomboid stretches today. Will refer to sports med for further eval, recs on focused exercise regimen for this. Pt agrees with plan.        Other Visit Diagnoses    Lipid screening       Relevant Orders   Lipid panel   Diabetes mellitus screening       Relevant Orders   Basic metabolic panel       Meds ordered this encounter  Medications  . sildenafil (VIAGRA) 100 MG tablet    Sig: Take 0.5-1 tablets (50-100 mg total) by mouth as needed (relations).    Dispense:  12 tablet    Refill:  3  . methocarbamol (ROBAXIN) 500 MG tablet    Sig: Take 1 tablet (500 mg total) by mouth 3 (three) times daily as needed for muscle spasms (sedation precautions).    Dispense:  40 tablet    Refill:  1   Orders Placed This  Encounter  Procedures  . Basic metabolic panel    Standing Status:   Future    Standing Expiration Date:   02/28/2019  . Lipid panel    Standing Status:   Future    Standing Expiration Date:   02/28/2019    Follow up plan: Return if symptoms worsen or fail to improve.  Ria Bush, MD

## 2018-02-27 NOTE — Assessment & Plan Note (Signed)
Preventative protocols reviewed and updated unless pt declined. Discussed healthy diet and lifestyle.  

## 2018-03-04 NOTE — Progress Notes (Signed)
Dr. Frederico Hamman T. Leydi Winstead, MD, [facility] [place], [address] Phone: [phone] Fax: [phone]  03/05/2018  Patient: Colton Rivera, MRN: [id number], DOB: 1981-01-03, 37 y.o.  Primary Physician:  Ria Bush, MD   Chief Complaint  Patient presents with  . Back Pain    Thoracic   Subjective:   Kellyn Mansfield is a 37 y.o. very pleasant male patient who presents with the following:  37 year old gentleman who has some persistent thoracic back pain and has known Scheuermann's kyphosis previously seen on thoracic MRI.  Included below.  He has had multiple orthopedic evaluations previously included by Dr. Rudene Christians as well as some of the other individuals at [facility].  Between the shoulder blades - told likely muscular?  Did have some that resolved.  Training at [employer] in [place]. Sparring, inversion, etc. Does not cause any pain.  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   Works out with [employer] 3x/week, sparring BJJ 1 time per week on the weekend.   Has tried PT, massage, chiropractor, without long-term improvement.  No radicular pains, numbness, or weakness.   Past Medical History, Surgical History, Social History, Family History, Problem List, Medications, and Allergies have been reviewed and updated if relevant.  Patient Active Problem List   Diagnosis Date Noted  . Scheuermann's kyphosis 02/27/2018  . BPH (benign prostatic hypertrophy) 02/19/2014  . Healthcare maintenance 07/25/2011  . ERECTILE DYSFUNCTION, NON-ORGANIC 07/22/2010  . ALLERGIC RHINITIS 07/22/2010    Past Medical History:  Diagnosis Date  . Allergic rhinitis    seasonal  . ED (erectile dysfunction)     Past Surgical History:  Procedure Laterality Date  . TONSILLECTOMY AND ADENOIDECTOMY  childhood    Social History   Socioeconomic History  . Marital status: Married    Spouse name: Not on file  . Number of children: 1  .  Years of education: Not on file  . Highest education level: Not on file  Occupational History  . 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 Financial resource strain: Not on file  . Food insecurity:    Worry: Not on file    Inability: Not on file  . Transportation needs:    Medical: Not on file    Non-medical: Not on file  Tobacco Use  . Smoking status: Never Smoker  . Smokeless tobacco: Never Used  Substance and Sexual Activity  . Alcohol use: Yes    Comment: Socially  . Drug use: No  . Sexual activity: Not on file  Lifestyle  . Physical activity:    Days per week: Not on file    Minutes per session: Not on file  . Stress: Not on file  Relationships  . Social connections:    Talks on phone: Not on file    Gets together: Not on file    Attends religious service: Not on file    Active member of club or organization: Not on file    Attends meetings of clubs or organizations: Not on file    Relationship status: Not on file  . Intimate partner violence:    Fear of current or ex partner: Not on file    Emotionally abused: Not on file    Physically abused: Not on file    Forced sexual activity: Not on file  Other Topics Concern  . Not on file  Social History Narrative  Caffeine: rare soda   Lives with wife, daughter (2011), dog   Occupation: Software engineer   Activity: insanity.  tries to work out 3x/wk, walking dog, mixed martial arts   Diet: eat out 1/wk, not too heavy with processed foods.  + fruits/vegetables.  Lots of water.    Family History  Problem Relation Age of Onset  . Cancer Paternal Grandmother        unknown kind  . Throat cancer Maternal Grandmother   . Liver cancer Maternal Aunt   . Cancer Maternal Aunt 65       colon  . Diabetes Maternal Uncle   . Stroke Neg Hx   . Coronary artery disease Neg Hx   . Hyperlipidemia Neg Hx     Allergies  Allergen Reactions  . Bentyl [Dicyclomine Hcl] Nausea  Only    Medication list reviewed and updated in full in [place].  GEN: No fevers, chills. Nontoxic. Primarily MSK c/o today. MSK: Detailed in the HPI GI: tolerating PO intake without difficulty Neuro: No numbness, parasthesias, or tingling associated. Otherwise the pertinent positives of the ROS are noted above.   Objective:   BP 100/70   Pulse (!) 54   Temp 98.4 F (36.9 C) (Oral)   Ht 6\' 4"  (1.93 m)   Wt 210 lb (95.3 kg)   BMI 25.56 kg/m    GEN: WDWN, NAD, Non-toxic, Alert & Oriented x 3 HEENT: Atraumatic, Normocephalic.  Ears and Nose: No external deformity. EXTR: No clubbing/cyanosis/edema NEURO: Normal gait.  PSYCH: Normally interactive. Conversant. Not depressed or anxious appearing.  Calm demeanor.   Shoulder: B Inspection: No muscle wasting or winging Ecchymosis/edema: neg  AC joint, scapula, clavicle: NT Cervical spine: NT, full ROM Spurling's: neg Abduction: full, 5/5 Flexion: full, 5/5 IR, full, lift-off: 5/5 ER at neutral: full, 5/5 AC crossover and compression: neg Neer: neg Hawkins: neg Drop Test: neg Empty Can: neg Supraspinatus insertion: NT Bicipital groove: NT Speed's: neg Yergason's: neg Sulcus sign: neg Scapular dyskinesis: mild-mod on the L with push-ups and lowering arms T-testing thumb up and down 4/5 on the L C5-T1 intact Sensation intact Grip 5/5   Radiology: CLINICAL DATA:  Pain at the level of the left shoulder blade for 1-1/2 years. No known injury. Initial encounter.  EXAM: MRI THORACIC SPINE WITHOUT CONTRAST  TECHNIQUE: Multiplanar, multisequence MR imaging of the thoracic spine was performed. No intravenous contrast was administered.  COMPARISON:  None.  FINDINGS: There is no fracture. Schmorl's nodes are seen at multiple levels of the thoracic spine with mild wedging compatible with Scheuermann's disease. Bone marrow signal is normal. Vertebral body alignment is normal. The thoracic cord demonstrates  normal signal throughout. Imaged paraspinous structures are unremarkable.  T3-4: Shallow right paracentral protrusion. The central canal and foramina are widely patent.  T6-7: Minimal disc bulge without central canal or foraminal narrowing.  T8-9: Very shallow disc bulge without central canal or foraminal narrowing.  Except as described above, intervertebral disc spaces are unremarkable.  IMPRESSION: Very mild degenerative disease of the thoracic spine without central canal or foraminal narrowing.  Findings consistent with Scheuermann's disease.   Electronically Signed   By: Inge Rise M.D.   On: 07/31/2015 18:33  Assessment and Plan:   Scapular dyskinesis  Scheuermann's kyphosis  Difficult to know how much the patient's Scheuermann's disease is playing a role here.  I pulled his MRI up myself and reviewed with him face-to-face.  He certainly does have some kyphotic changes, but he  is already had a very good conservative treatment of this. Nevertheless, this could be the primary source of his pain.  He does have some strength imbalances with the scapula and some scapular dysfunction mechanically on the left, which is the side that is bothering him the most.  I gave him a scapular dyskinesis program from [facility], and I am good to have him focus on this over the next 6 weeks and then recheck him.  I appreciate the opportunity to evaluate this very friendly patient. If you have any question regarding her care or prognosis, do not hesitate to ask.   Follow-up: 6 weeks  Signed,  Tylee Yum T. Zoanne Newill, MD   Allergies as of 03/05/2018      Reactions   Bentyl [dicyclomine Hcl] Nausea Only      Medication List        Accurate as of 03/05/18 10:36 AM. Always use your most recent med list.          fexofenadine 180 MG tablet Commonly known as:  ALLEGRA Take 180 mg by mouth daily.   fluticasone 50 MCG/ACT nasal spray Commonly known as:  FLONASE USE 2  SPRAYS IN EACH NOSTRIL DAILY   methocarbamol 500 MG tablet Commonly known as:  ROBAXIN Take 1 tablet (500 mg total) by mouth 3 (three) times daily as needed for muscle spasms (sedation precautions).   multivitamin tablet Take 1 tablet by mouth daily.   sildenafil 100 MG tablet Commonly known as:  VIAGRA Take 0.5-1 tablets (50-100 mg total) by mouth as needed (relations).

## 2018-03-05 ENCOUNTER — Other Ambulatory Visit (INDEPENDENT_AMBULATORY_CARE_PROVIDER_SITE_OTHER): Payer: Managed Care, Other (non HMO)

## 2018-03-05 ENCOUNTER — Ambulatory Visit: Payer: Managed Care, Other (non HMO) | Admitting: Family Medicine

## 2018-03-05 ENCOUNTER — Other Ambulatory Visit: Payer: Self-pay

## 2018-03-05 ENCOUNTER — Encounter: Payer: Self-pay | Admitting: Family Medicine

## 2018-03-05 VITALS — BP 100/70 | HR 54 | Temp 98.4°F | Ht 76.0 in | Wt 210.0 lb

## 2018-03-05 DIAGNOSIS — M4200 Juvenile osteochondrosis of spine, site unspecified: Secondary | ICD-10-CM

## 2018-03-05 DIAGNOSIS — G2589 Other specified extrapyramidal and movement disorders: Secondary | ICD-10-CM | POA: Diagnosis not present

## 2018-03-05 DIAGNOSIS — Z131 Encounter for screening for diabetes mellitus: Secondary | ICD-10-CM

## 2018-03-05 DIAGNOSIS — Z13220 Encounter for screening for lipoid disorders: Secondary | ICD-10-CM

## 2018-03-05 LAB — LIPID PANEL
Cholesterol: 152 mg/dL (ref 0–200)
HDL: 48 mg/dL (ref >39.00)
LDL Cholesterol: 95 mg/dL (ref 0–99)
NonHDL: 103.92
TRIGLYCERIDES: 45 mg/dL (ref 0.0–149.0)
Total CHOL/HDL Ratio: 3
VLDL: 9 mg/dL (ref 0.0–40.0)

## 2018-03-05 LAB — BASIC METABOLIC PANEL
BUN: 15 mg/dL (ref 6–23)
CHLORIDE: 103 meq/L (ref 96–112)
CO2: 31 mEq/L (ref 19–32)
Calcium: 9.5 mg/dL (ref 8.4–10.5)
Creatinine, Ser: 1.16 mg/dL (ref 0.40–1.50)
GFR: 74.41 mL/min (ref >60.00)
GLUCOSE: 85 mg/dL (ref 70–99)
POTASSIUM: 4.7 meq/L (ref 3.5–5.1)
Sodium: 139 mEq/L (ref 135–145)

## 2018-03-06 ENCOUNTER — Other Ambulatory Visit: Payer: Managed Care, Other (non HMO)

## 2018-11-19 ENCOUNTER — Ambulatory Visit (INDEPENDENT_AMBULATORY_CARE_PROVIDER_SITE_OTHER): Payer: 59

## 2018-11-19 ENCOUNTER — Encounter: Payer: Self-pay | Admitting: Family Medicine

## 2018-11-19 ENCOUNTER — Ambulatory Visit: Payer: 59 | Admitting: Family Medicine

## 2018-11-19 VITALS — BP 110/70 | HR 69 | Temp 98.8°F | Ht 76.0 in | Wt 205.8 lb

## 2018-11-19 DIAGNOSIS — M25512 Pain in left shoulder: Secondary | ICD-10-CM

## 2018-11-19 MED ORDER — DICLOFENAC SODIUM 2 % TD SOLN: 1.0000 "application " | Freq: Two times a day (BID) | TRANSDERMAL | 3 refills | Status: DC

## 2018-11-19 NOTE — Assessment & Plan Note (Signed)
Does not seem to be related to the rotator cuff with [place] that was reassuring and clinical exam elicited no significant pain.  Possible that he is irritated the long head of the triceps. Has a history of Scheuermann's disease so could have lengthened this origin to the scapula.  -Counseled on home exercise therapy and supportive care. -Initiate Pennsaid. -Counseled on compression -If no improvement may need to consider nitro versus imaging versus physical therapy.

## 2018-11-19 NOTE — Progress Notes (Signed)
Colton Rivera - 38 y.o. male MRN [id number]  Date of birth: 01/22/1981  SUBJECTIVE:  Including CC & ROS.  Chief Complaint  Patient presents with  . Shoulder Pain    left , x2 months    Colton Rivera is a 38 y.o. male that is presenting with left shoulder pain.  The pain is occurring on the posterior aspect of the upper arm.  He noticed it when he was bench pressing about 2 months ago.  Since that time the pain has been ongoing.  It has not gone away completely.  It is mild and intermittent.  He feels it most with doing abduction or pushing out.  He denies any ecchymosis or swelling.  He has not lifted since that time.  Denies any history of similar symptoms.  No radicular symptoms.  Has not taken anything for the pain.  Pain is sharp in nature.    Review of Systems  Constitutional: Negative for fever.  HENT: Negative for congestion.   Respiratory: Negative for cough.   Cardiovascular: Negative for chest pain.  Gastrointestinal: Negative for abdominal pain.  Musculoskeletal: Negative for joint swelling.  Skin: Negative for color change.  Neurological: Negative for weakness.  Hematological: Negative for adenopathy.  Psychiatric/Behavioral: Negative for agitation.    HISTORY: Past Medical, Surgical, Social, and Family History Reviewed & Updated per EMR.   Pertinent Historical Findings include:  Past Medical History:  Diagnosis Date  . Allergic rhinitis    seasonal  . ED (erectile dysfunction)     Past Surgical History:  Procedure Laterality Date  . TONSILLECTOMY AND ADENOIDECTOMY  childhood    Allergies  Allergen Reactions  . Bentyl [Dicyclomine Hcl] Nausea Only    Family History  Problem Relation Age of Onset  . Cancer Paternal Grandmother        unknown kind  . Throat cancer Maternal Grandmother   . Liver cancer Maternal Aunt   . Cancer Maternal Aunt 65       colon  . Diabetes Maternal Uncle   . Stroke Neg Hx   . Coronary artery disease Neg Hx   . Hyperlipidemia  Neg Hx      Social History   Socioeconomic History  . Marital status: Married    Spouse name: Not on file  . Number of children: 1  . Years of education: Not on file  . Highest education level: Not on file  Occupational History  . 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 Financial resource strain: Not on file  . Food insecurity:    Worry: Not on file    Inability: Not on file  . Transportation needs:    Medical: Not on file    Non-medical: Not on file  Tobacco Use  . Smoking status: Never Smoker  . Smokeless tobacco: Never Used  Substance and Sexual Activity  . Alcohol use: Yes    Comment: Socially  . Drug use: No  . Sexual activity: Not on file  Lifestyle  . Physical activity:    Days per week: Not on file    Minutes per session: Not on file  . Stress: Not on file  Relationships  . Social connections:    Talks on phone: Not on file    Gets together: Not on file    Attends religious service: Not on file    Active member of club or organization: Not on file    Attends meetings of clubs or organizations: Not  on file    Relationship status: Not on file  . Intimate partner violence:    Fear of current or ex partner: Not on file    Emotionally abused: Not on file    Physically abused: Not on file    Forced sexual activity: Not on file  Other Topics Concern  . Not on file  Social History Narrative   Caffeine: rare soda   Lives with wife, daughter (2011), dog   Occupation: Software engineer   Activity: insanity.  tries to work out 3x/wk, walking dog, mixed martial arts   Diet: eat out 1/wk, not too heavy with processed foods.  + fruits/vegetables.  Lots of water.     PHYSICAL EXAM:  VS: BP 110/70   Pulse 69   Temp 98.8 F (37.1 C) (Oral)   Ht 6\' 4"  (1.93 m)   Wt 205 lb 12.8 oz (93.4 kg)   SpO2 98%   BMI 25.05 kg/m  Physical Exam Gen: NAD, alert, cooperative with exam, well-appearing ENT: normal lips,  normal nasal mucosa,  Eye: normal EOM, normal conjunctiva and lids CV:  no edema, +2 pedal pulses   Resp: no accessory muscle use, non-labored,   Skin: no rashes, no areas of induration  Neuro: normal tone, normal sensation to touch Psych:  normal insight, alert and oriented MSK:  Left shoulder: Normal active flexion and abduction. Normal strength resistance with internal and external rotation. Normal external rotation. Mild pain with Hawkins testing. No pain with O'Brien's testing. Normal speeds test. Normal grip strength. No signs of atrophy. Neurovascularly intact  Limited ultrasound: Left shoulder:  Normal-appearing biceps tendon. Normal-appearing subscapularis and static and dynamic testing. Supraspinatus with mild tendinopathy type changes.  Normal-appearing dynamic testing. Normal-appearing infraspinatus. Normal-appearing posterior glenohumeral joint.   Summary: Only mild changes within the supraspinatus to suggest tendinopathy.  Ultrasound and interpretation by Clearance Coots, MD      ASSESSMENT & PLAN:   Acute pain of left shoulder Does not seem to be related to the rotator cuff with [place] that was reassuring and clinical exam elicited no significant pain.  Possible that he is irritated the long head of the triceps. Has a history of Scheuermann's disease so could have lengthened this origin to the scapula.  -Counseled on home exercise therapy and supportive care. -Initiate Pennsaid. -Counseled on compression -If no improvement may need to consider nitro versus imaging versus physical therapy.

## 2018-11-19 NOTE — Patient Instructions (Signed)
Nice to meet you  Please try the rehab exercises  Please try the rub on medicine  Please try ice on the area.  Please see me back in 4-6 weeks if no better.

## 2018-11-26 ENCOUNTER — Other Ambulatory Visit: Payer: Self-pay | Admitting: Family Medicine

## 2018-11-26 NOTE — Telephone Encounter (Signed)
Last office visit 03/05/2018 with Dr. Lorelei Pont for Scapular dyskinesis.  Also seen by Clearance Coots on 11/19/2018 for Acute shoulder pain.   Last refilled 02/27/2018 for #40 with 1 refill.  No future appointments.

## 2019-01-03 ENCOUNTER — Ambulatory Visit: Payer: 59 | Admitting: Family Medicine

## 2019-01-08 ENCOUNTER — Ambulatory Visit: Payer: 59 | Admitting: Family Medicine

## 2019-01-08 ENCOUNTER — Encounter: Payer: Self-pay | Admitting: Family Medicine

## 2019-01-08 ENCOUNTER — Ambulatory Visit (INDEPENDENT_AMBULATORY_CARE_PROVIDER_SITE_OTHER): Payer: 59

## 2019-01-08 VITALS — BP 110/76 | HR 58 | Temp 98.5°F | Ht 76.0 in | Wt 208.0 lb

## 2019-01-08 DIAGNOSIS — M25512 Pain in left shoulder: Secondary | ICD-10-CM | POA: Diagnosis not present

## 2019-01-08 NOTE — Progress Notes (Signed)
Colton Rivera - 38 y.o. male MRN [id number]  Date of birth: 1981/11/05  SUBJECTIVE:  Including CC & ROS.  Chief Complaint  Patient presents with  . Pain    left shoulder and arm pain/ pain with certain motions, hasn't improved since last visit     Yonatan Rivera is a 38 y.o. male that is  Presenting with acute worsening of his chronic left shoulder. Has pain with certain movements in abduction or external rotation. Pain posterior of the shoulder joint. No radicular symptoms. Has been present for 4 months. No improvement with home exercises. Takes ibuprofen as needed. Pain is mild to moderate.  He feels he may have injured his shoulder while performing jujitsu.  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   Review of Systems  Constitutional: Negative for fever.  HENT: Negative for congestion.   Respiratory: Negative for cough.   Cardiovascular: Negative for chest pain.  Gastrointestinal: Negative for abdominal pain.  Musculoskeletal: Negative for back pain.  Skin: Negative for color change.  Neurological: Negative for weakness.  Hematological: Negative for adenopathy.  Psychiatric/Behavioral: Negative for agitation.    HISTORY: Past Medical, Surgical, Social, and Family History Reviewed & Updated per EMR.   Pertinent Historical Findings include:  Past Medical History:  Diagnosis Date  . Allergic rhinitis    seasonal  . ED (erectile dysfunction)     Past Surgical History:  Procedure Laterality Date  . TONSILLECTOMY AND ADENOIDECTOMY  childhood    Allergies  Allergen Reactions  . Bentyl [Dicyclomine Hcl] Nausea Only    Family History  Problem Relation Age of Onset  . Cancer Paternal Grandmother        unknown kind  . Throat cancer Maternal Grandmother   . Liver cancer Maternal Aunt   . Cancer Maternal Aunt 65       colon  . Diabetes Maternal Uncle   . Stroke Neg Hx   . Coronary artery disease Neg Hx   . Hyperlipidemia Neg Hx      Social History    Socioeconomic History  . Marital status: Married    Spouse name: Not on file  . Number of children: 1  . Years of education: Not on file  . Highest education level: Not on file  Occupational History  . 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 Financial resource strain: Not on file  . Food insecurity:    Worry: Not on file    Inability: Not on file  . Transportation needs:    Medical: Not on file    Non-medical: Not on file  Tobacco Use  . Smoking status: Never Smoker  . Smokeless tobacco: Never Used  Substance and Sexual Activity  . Alcohol use: Yes    Comment: Socially  . Drug use: No  . Sexual activity: Not on file  Lifestyle  . Physical activity:    Days per week: Not on file    Minutes per session: Not on file  . Stress: Not on file  Relationships  . Social connections:    Talks on phone: Not on file    Gets together: Not on file    Attends religious service: Not on file    Active member of club or organization: Not on file    Attends meetings of clubs or organizations: Not on file    Relationship status: Not on file  . Intimate partner violence:    Fear of current or  ex partner: Not on file    Emotionally abused: Not on file    Physically abused: Not on file    Forced sexual activity: Not on file  Other Topics Concern  . Not on file  Social History Narrative   Caffeine: rare soda   Lives with wife, daughter (2011), dog   Occupation: Software engineer   Activity: insanity.  tries to work out 3x/wk, walking dog, mixed martial arts   Diet: eat out 1/wk, not too heavy with processed foods.  + fruits/vegetables.  Lots of water.     PHYSICAL EXAM:  VS: BP 110/76   Pulse (!) 58   Temp 98.5 F (36.9 C) (Oral)   Ht 6\' 4"  (1.93 m)   Wt 208 lb (94.3 kg)   SpO2 99%   BMI 25.32 kg/m  Physical Exam Gen: NAD, alert, cooperative with exam, well-appearing ENT: normal lips, normal nasal mucosa,  Eye: normal  EOM, normal conjunctiva and lids CV:  no edema, +2 pedal pulses   Resp: no accessory muscle use, non-labored,  Skin: no rashes, no areas of induration  Neuro: normal tone, normal sensation to touch Psych:  normal insight, alert and oriented MSK:  Left Shoulder: Inspection reveals no abnormalities, atrophy or asymmetry. Palpation is normal with no tenderness over AC joint or bicipital groove. ROM is full in all planes. Pain with external rotation and abduction. Normal strength resistance with internal and external rotation. No significant pain with empty can testing. Positive O'Brien's testing. Pain with apprehension testing  No painful arc and no drop arm sign. Neurovascularly intact       ASSESSMENT & PLAN:   Acute pain of left shoulder Pain seems to be more associated with the labrum.  Has been ongoing for 4 months.  Possible to have an injury during jujitsu.  Has not had no improvement with home exercises. -X-ray. -MRI to evaluate for labral tear.

## 2019-01-08 NOTE — Assessment & Plan Note (Signed)
Pain seems to be more associated with the labrum.  Has been ongoing for 4 months.  Possible to have an injury during jujitsu.  Has not had no improvement with home exercises. -X-ray. -MRI to evaluate for labral tear.

## 2019-01-08 NOTE — Patient Instructions (Signed)
Good to see you  Please try the duexis if you have pain. I can send more in.  I will call you with the results from today  You will get a call about the MRI and I will call you after it is completed.

## 2019-01-17 ENCOUNTER — Telehealth: Payer: Self-pay | Admitting: Family Medicine

## 2019-01-17 ENCOUNTER — Other Ambulatory Visit: Payer: Self-pay

## 2019-01-17 ENCOUNTER — Ambulatory Visit
Admission: RE | Admit: 2019-01-17 | Discharge: 2019-01-17 | Disposition: A | Discharge status: Home / Self Care | Payer: 59 | Source: Ambulatory Visit | Attending: Family Medicine | Admitting: Family Medicine

## 2019-01-17 DIAGNOSIS — M25512 Pain in left shoulder: Secondary | ICD-10-CM

## 2019-01-17 NOTE — Telephone Encounter (Signed)
Information provided to the patient per note below.  At this time, patient wants to think about physical therapy or an injection, and call back with his decision. /

## 2019-01-17 NOTE — Telephone Encounter (Signed)
Left VM for patient. If he calls back please have him speak with a nurse/CMA and inform that his MRi shows changes of the [facility] joint and clavicle. Could consider physical therapy or injection. The [facility] can report results to patient.   If any questions then please take the best time and phone number to call and I will try to call him back.   Rosemarie Ax, MD [facility] 01/17/2019, 2:17 PM

## 2019-01-18 NOTE — Telephone Encounter (Signed)
Pt said the test results of the MRI seem unclear. He is requesting call from Dr. Raeford Razor to discuss.  Ph# [phone]

## 2019-01-18 NOTE — Telephone Encounter (Signed)
Dr. Schmitz please advise? 

## 2019-03-13 ENCOUNTER — Other Ambulatory Visit: Payer: Self-pay | Admitting: Family Medicine

## 2019-03-14 NOTE — Telephone Encounter (Signed)
E-scribed refill.  Pls schedule annual CPE.

## 2019-03-15 NOTE — Telephone Encounter (Signed)
LVM for patient to call us back and schedule cpe.

## 2019-04-26 NOTE — Telephone Encounter (Signed)
LVM for patient to call back and schedule a CPE

## 2019-04-30 ENCOUNTER — Other Ambulatory Visit: Payer: Self-pay | Admitting: Family Medicine

## 2019-05-01 NOTE — Telephone Encounter (Signed)
Electronic refill request viagra Last refill 03/14/19 Last office visit 02/27/18

## 2019-05-17 ENCOUNTER — Encounter: Payer: Self-pay | Admitting: Family Medicine

## 2019-05-17 NOTE — Telephone Encounter (Signed)
Left voicemail and mailed letter 05/17/19

## 2019-08-24 ENCOUNTER — Other Ambulatory Visit: Payer: Self-pay | Admitting: Family Medicine

## 2020-08-30 IMAGING — MR MRI OF THE LEFT SHOULDER WITHOUT CONTRAST
4 of 5 series · 27 of 40 positions shown · non-contrast
Comparison: Plain films left shoulder 01/08/2019.

CLINICAL DATA: Left shoulder and arm pain with limited range of
motion since August 2018. No known injury.

EXAM:
MRI OF THE LEFT SHOULDER WITHOUT CONTRAST
TECHNIQUE: Multiplanar, multisequence MR imaging of the shoulder was performed.
No intravenous contrast was administered.

[Series 3: PD fat-sat · axial · 4.0mm · 0.27mm/px · z∈[-31,+59]mm · 8 of 20 slices shown (1 of 2)]
[im 1/20]
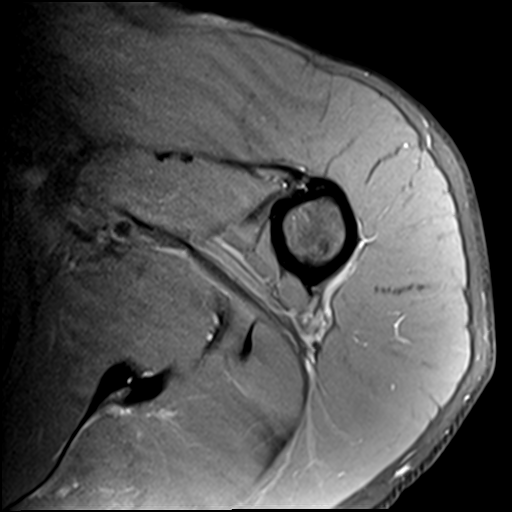
[im 3/20]
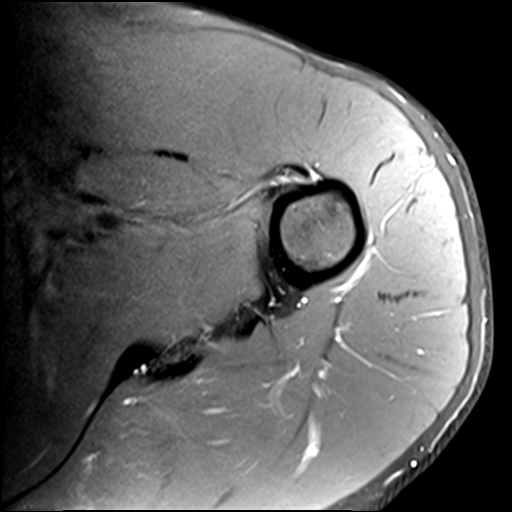
[im 6/20]
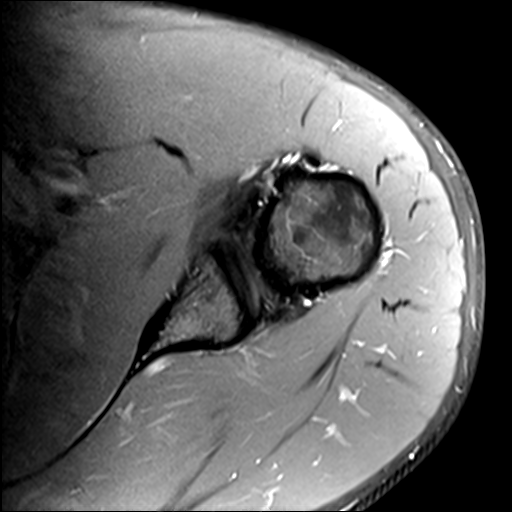
[im 9/20]
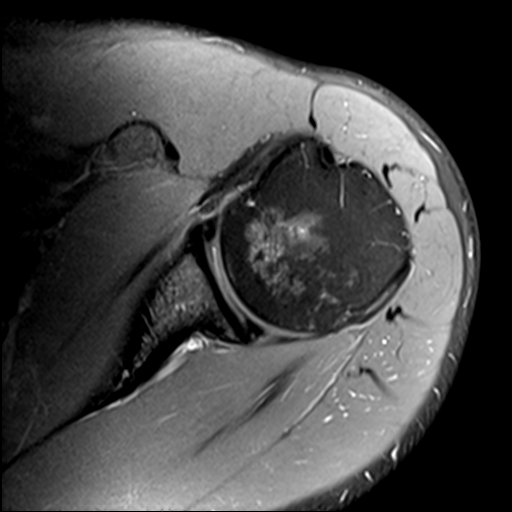
[im 11/20]
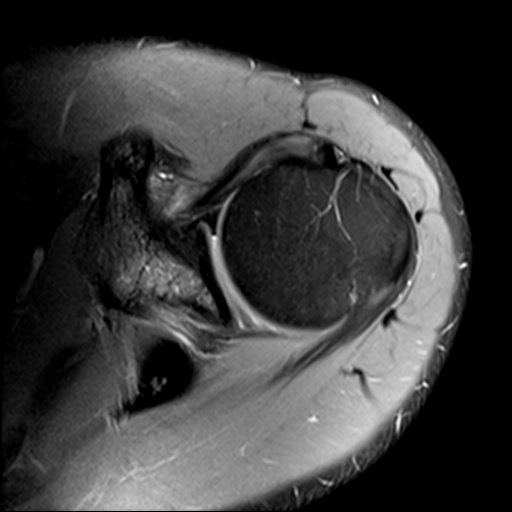
[im 14/20]
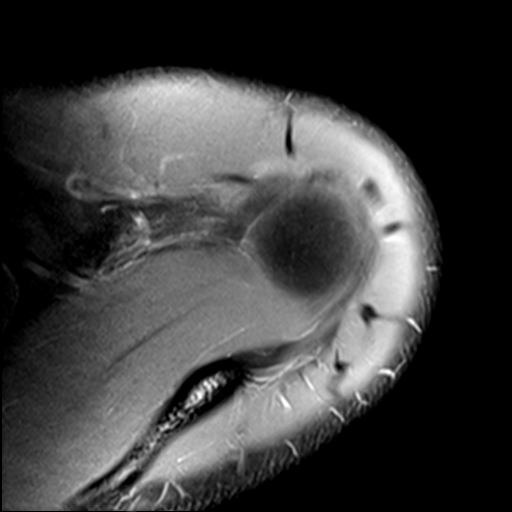
[im 17/20]
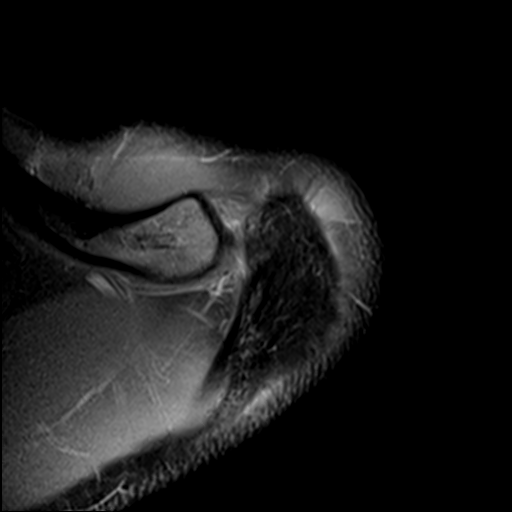
[im 20/20]
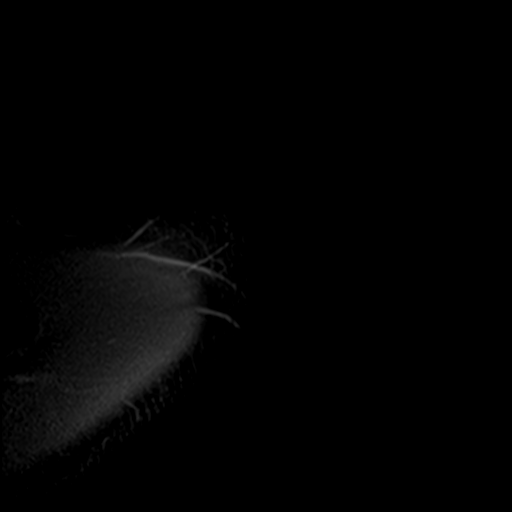

[Series 4: T2 fat-sat · oblique · 4.0mm · 0.55mm/px · 8 of 22 slices shown (1 of 2)]
[im 1/22]
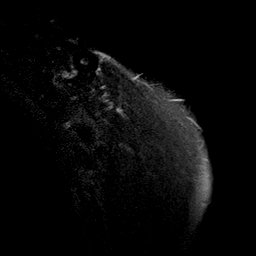
[im 4/22]
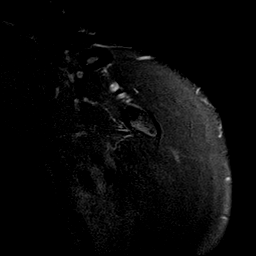
[im 7/22]
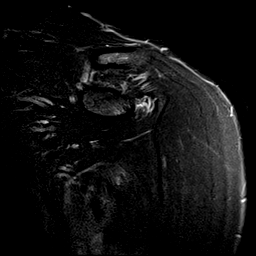
[im 10/22]
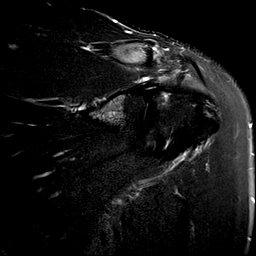
[im 13/22]
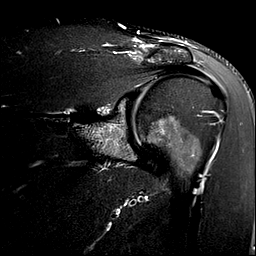
[im 16/22]
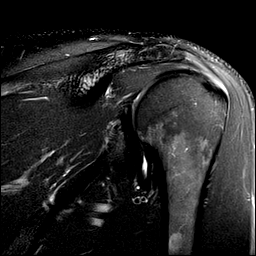
[im 19/22]
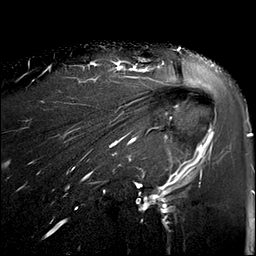
[im 22/22]
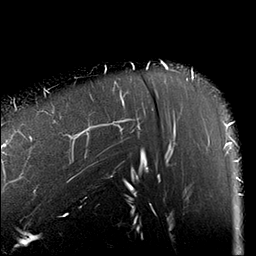

[Series 5: PD fat-sat · oblique · 4.0mm · 0.27mm/px · 8 of 22 slices shown (2 of 2)]
[im 1/22]
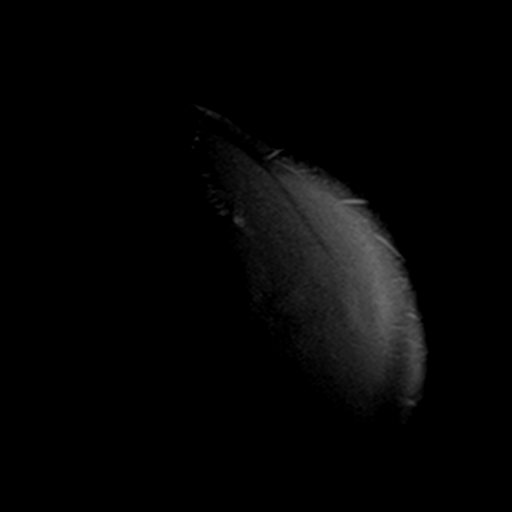
[im 4/22]
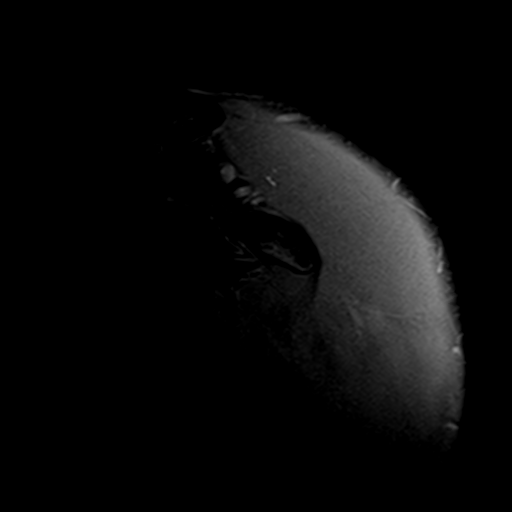
[im 7/22]
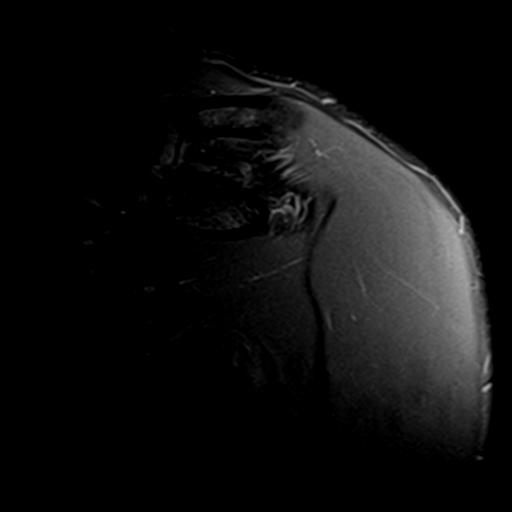
[im 10/22]
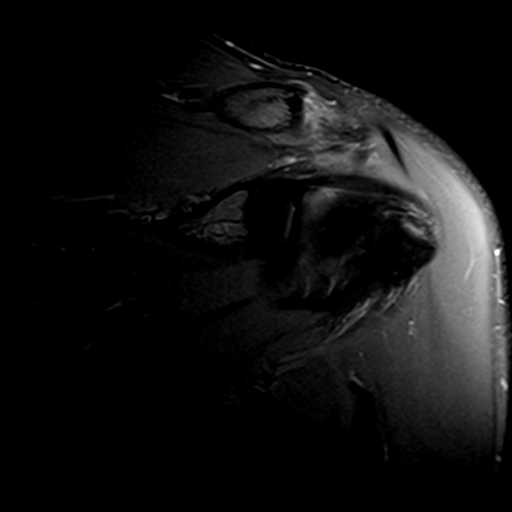
[im 13/22]
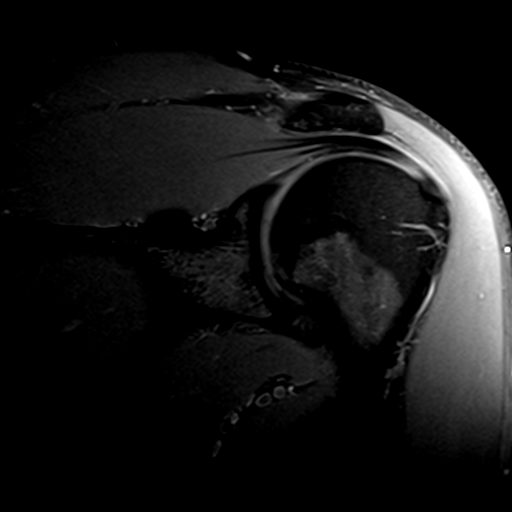
[im 16/22]
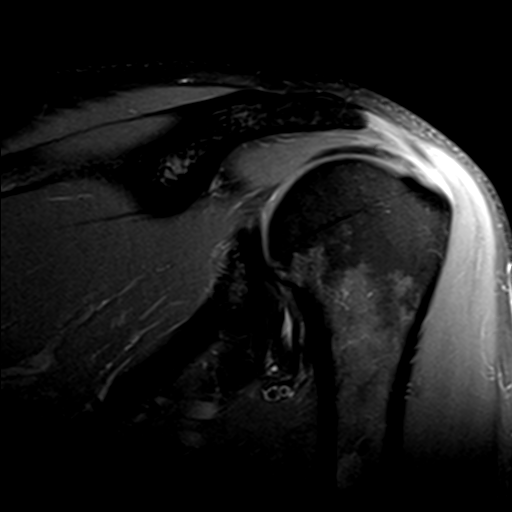
[im 19/22]
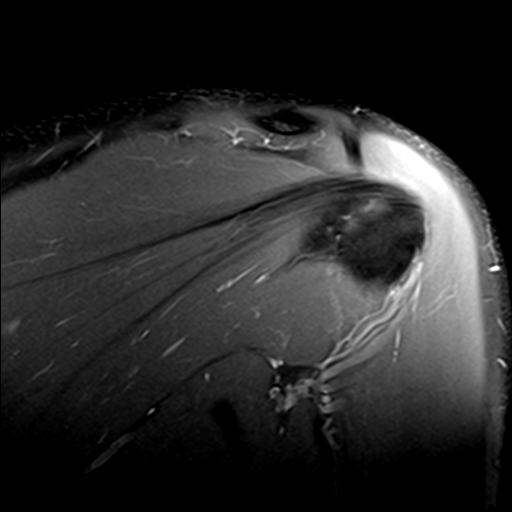
[im 22/22]
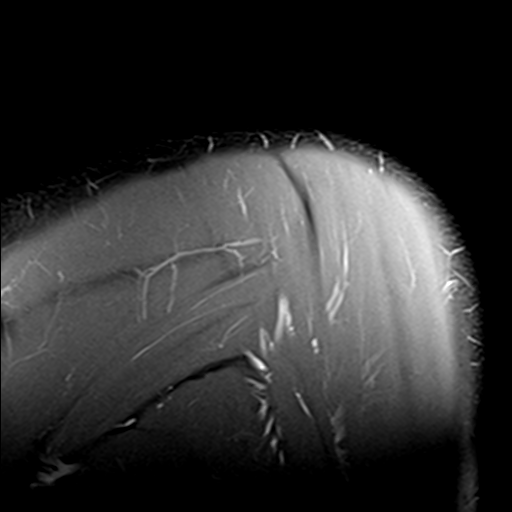

[Series 6: T2 fat-sat · oblique · 4.0mm · 0.55mm/px · 3 of 22 slices shown (2 of 2)]
[im 4/22]
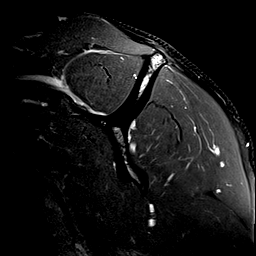
[im 13/22]
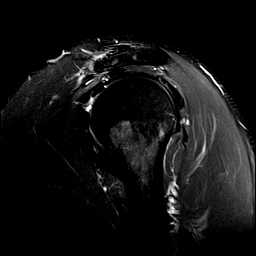
[im 19/22]
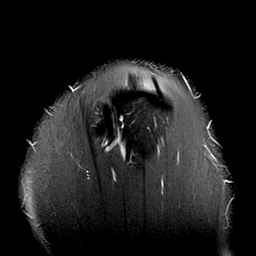

[27 of 40 positions shown; findings below may reference images not displayed]

FINDINGS: Rotator cuff:  Intact and normal in appearance.

Muscles:  Normal without atrophy or focal lesion.

Biceps long head:  Intact and normal in appearance.

Acromioclavicular Joint: Appears mildly widened. The
acromioclavicular ligament is not visualized. There is edema in the
head and neck of the clavicle. Type 2 acromion. Small amount of
subacromial/subdeltoid fluid noted.

Glenohumeral Joint: Normal.

Labrum:  Intact.

Bones:  As above.  Otherwise negative.

Other: None.
IMPRESSION: Although no history of trauma is provided, the AC joint appears
mildly widened and the acromioclavicular ligament is not visualized
worrisome for grade 2 AC joint separation.

Edema in the head and neck of the clavicle worrisome for distal
clavicular osteolysis. No bone loss is seen at this time. The
acromioclavicular joint appears normal.

Intact and normal appearing rotator cuff, labrum and long head of
biceps.

## 2021-04-13 ENCOUNTER — Ambulatory Visit (INDEPENDENT_AMBULATORY_CARE_PROVIDER_SITE_OTHER): Payer: BC Managed Care – PPO | Admitting: Family Medicine

## 2021-04-13 ENCOUNTER — Other Ambulatory Visit: Payer: Self-pay

## 2021-04-13 ENCOUNTER — Encounter: Payer: Self-pay | Admitting: Family Medicine

## 2021-04-13 VITALS — BP 122/70 | HR 66 | Temp 97.9°F | Ht 76.0 in | Wt 199.4 lb

## 2021-04-13 DIAGNOSIS — Z114 Encounter for screening for human immunodeficiency virus [HIV]: Secondary | ICD-10-CM

## 2021-04-13 DIAGNOSIS — F528 Other sexual dysfunction not due to a substance or known physiological condition: Secondary | ICD-10-CM

## 2021-04-13 DIAGNOSIS — J3089 Other allergic rhinitis: Secondary | ICD-10-CM

## 2021-04-13 DIAGNOSIS — M4200 Juvenile osteochondrosis of spine, site unspecified: Secondary | ICD-10-CM

## 2021-04-13 DIAGNOSIS — Z131 Encounter for screening for diabetes mellitus: Secondary | ICD-10-CM

## 2021-04-13 DIAGNOSIS — Z23 Encounter for immunization: Secondary | ICD-10-CM | POA: Diagnosis not present

## 2021-04-13 DIAGNOSIS — L989 Disorder of the skin and subcutaneous tissue, unspecified: Secondary | ICD-10-CM | POA: Insufficient documentation

## 2021-04-13 DIAGNOSIS — Z1159 Encounter for screening for other viral diseases: Secondary | ICD-10-CM

## 2021-04-13 DIAGNOSIS — Z0000 Encounter for general adult medical examination without abnormal findings: Secondary | ICD-10-CM

## 2021-04-13 LAB — BASIC METABOLIC PANEL
BUN: 18 mg/dL (ref 6–23)
CO2: 27 mEq/L (ref 19–32)
Calcium: 9.8 mg/dL (ref 8.4–10.5)
Chloride: 104 mEq/L (ref 96–112)
Creatinine, Ser: 1.16 mg/dL (ref 0.40–1.50)
GFR: 77.78 mL/min (ref >60.00)
Glucose, Bld: 89 mg/dL (ref 70–99)
Potassium: 4.8 mEq/L (ref 3.5–5.1)
Sodium: 139 mEq/L (ref 135–145)

## 2021-04-13 MED ORDER — SILDENAFIL CITRATE 100 MG PO TABS: 50.0000 mg | ORAL_TABLET | ORAL | 3 refills | Status: DC | PRN

## 2021-04-13 MED ORDER — METHOCARBAMOL 500 MG PO TABS: 500.0000 mg | ORAL_TABLET | Freq: Three times a day (TID) | ORAL | 3 refills | Status: DC | PRN

## 2021-04-13 NOTE — Progress Notes (Signed)
Patient ID: Colton Rivera, male    DOB: 26-Aug-1981, 40 y.o.   MRN: [id number]  This visit was conducted in person.  BP 122/70   Pulse 66   Temp 97.9 F (36.6 C) (Temporal)   Ht 6\' 4"  (1.93 m)   Wt 199 lb 6 oz (90.4 kg)   SpO2 99%   BMI 24.27 kg/m    CC: CPE  Subjective:   HPI: Colton Rivera is a 40 y.o. male presenting on 04/13/2021 for Annual Exam and Medication Refill (Needs refill for Viagra sent to [facility]. )   Last seen 02/2018.  Requests methocarbamol refilled. Ongoing chronic scapular dyskinesia has seen ortho, SM, PT, integrative therapies, etc. Finds daily warm up and stretches/massage helps. Known Scheuermann's disease.   Preventative: No fmhx colon or prostate cancer  Influeza yearly COVID vaccine Pfizer 01/2020, 02/2020, booster 09/2020 Td 2011, Tdap today Seat belt use discussed Sunscreen use discussed. No changing moles on skin - wants spot on back checked.  Non smoker Alcohol - 2-3 drinks a week Dentist - due  Eye exam - has not seen   Lives with wife, daughter (2011), dog Occupation: Software engineer Activity: weight lifting, jiu jitsu, daily stretching  Diet: daily fruits/vegetables. Good water.      Relevant past medical, surgical, family and social history reviewed and updated as indicated. Interim medical history since our last visit reviewed. Allergies and medications reviewed and updated. Outpatient Medications Prior to Visit  Medication Sig Dispense Refill  . fexofenadine (ALLEGRA) 180 MG tablet Take 180 mg by mouth daily.    . fluticasone (FLONASE) 50 MCG/ACT nasal spray USE 2 SPRAYS IN EACH NOSTRIL DAILY 16 g 3  . Multiple Vitamin (MULTIVITAMIN) tablet Take 1 tablet by mouth daily.    . methocarbamol (ROBAXIN) 500 MG tablet TAKE 1 TABLET BY MOUTH 3 TIMES DAILY AS NEEDED FOR MUSCLE SPASMS (SEDATION PRECAUTIONS). 40 tablet 1  . sildenafil (VIAGRA) 100 MG tablet TAKE 0.5-1 TABLET BY MOUTH AS NEEDED (RELATIONS) 8 tablet 3  .  Diclofenac Sodium (PENNSAID) 2 % SOLN Place 1 application onto the skin 2 (two) times daily. 1 Bottle 3   No facility-administered medications prior to visit.     Per HPI unless specifically indicated in ROS section below Review of Systems  Constitutional: Negative for activity change, appetite change, chills, fatigue, fever and unexpected weight change.  HENT: Negative for hearing loss.   Eyes: Negative for visual disturbance.  Respiratory: Negative for cough, chest tightness, shortness of breath and wheezing.   Cardiovascular: Negative for chest pain, palpitations and leg swelling.  Gastrointestinal: Negative for abdominal distention, abdominal pain, blood in stool, constipation, diarrhea, nausea and vomiting.  Genitourinary: Negative for difficulty urinating and hematuria.  Musculoskeletal: Negative for arthralgias, myalgias and neck pain.  Skin: Negative for rash.  Neurological: Negative for dizziness, seizures, syncope and headaches.  Hematological: Negative for adenopathy. Does not bruise/bleed easily.  Psychiatric/Behavioral: Negative for dysphoric mood. The patient is not nervous/anxious.    Objective:  BP 122/70   Pulse 66   Temp 97.9 F (36.6 C) (Temporal)   Ht 6\' 4"  (1.93 m)   Wt 199 lb 6 oz (90.4 kg)   SpO2 99%   BMI 24.27 kg/m   Wt Readings from Last 3 Encounters:  04/13/21 199 lb 6 oz (90.4 kg)  01/08/19 208 lb (94.3 kg)  11/19/18 205 lb 12.8 oz (93.4 kg)      Physical Exam Vitals and nursing note reviewed.  Constitutional:      General: He is not in acute distress.    Appearance: Normal appearance. He is well-developed. He is not ill-appearing.  HENT:     Head: Normocephalic and atraumatic.     Right Ear: Hearing, tympanic membrane, ear canal and external ear normal.     Left Ear: Hearing, tympanic membrane, ear canal and external ear normal.  Eyes:     General: No scleral icterus.    Extraocular Movements: Extraocular movements intact.      Conjunctiva/sclera: Conjunctivae normal.     Pupils: Pupils are equal, round, and reactive to light.  Neck:     Thyroid: No thyroid mass or thyromegaly.  Cardiovascular:     Rate and Rhythm: Normal rate and regular rhythm.     Pulses: Normal pulses.          Radial pulses are 2+ on the right side and 2+ on the left side.     Heart sounds: Normal heart sounds. No murmur heard.   Pulmonary:     Effort: Pulmonary effort is normal. No respiratory distress.     Breath sounds: Normal breath sounds. No wheezing, rhonchi or rales.  Abdominal:     General: Abdomen is flat. Bowel sounds are normal. There is no distension.     Palpations: Abdomen is soft. There is no mass.     Tenderness: There is no abdominal tenderness. There is no guarding or rebound.     Hernia: No hernia is present.  Musculoskeletal:        General: Normal range of motion.     Cervical back: Normal range of motion and neck supple.     Right lower leg: No edema.     Left lower leg: No edema.  Lymphadenopathy:     Cervical: No cervical adenopathy.  Skin:    General: Skin is warm and dry.     Findings: Lesion present. No rash.          Comments: Sub cm nodule to L upper back with central scab  Neurological:     General: No focal deficit present.     Mental Status: He is alert and oriented to person, place, and time.     Comments: CN grossly intact, station and gait intact  Psychiatric:        Mood and Affect: Mood normal.        Behavior: Behavior normal.        Thought Content: Thought content normal.        Judgment: Judgment normal.       Results for orders placed or performed in visit on 03/05/18  Lipid panel  Result Value Ref Range   Cholesterol 152 0 - 200 mg/dL   Triglycerides 45.0 0.0 - 149.0 mg/dL   HDL 48.00 >39.00 mg/dL   VLDL 9.0 0.0 - 40.0 mg/dL   LDL Cholesterol 95 0 - 99 mg/dL   Total CHOL/HDL Ratio 3    NonHDL 518.84   Basic metabolic panel  Result Value Ref Range   Sodium 139 135 - 145  mEq/L   Potassium 4.7 3.5 - 5.1 mEq/L   Chloride 103 96 - 112 mEq/L   CO2 31 19 - 32 mEq/L   Glucose, Bld 85 70 - 99 mg/dL   BUN 15 6 - 23 mg/dL   Creatinine, Ser 1.16 0.40 - 1.50 mg/dL   Calcium 9.5 8.4 - 10.5 mg/dL   GFR 74.41 >60.00 mL/min   Assessment & Plan:  This visit occurred during the SARS-CoV-2 public health emergency.  Safety protocols were in place, including screening questions prior to the visit, additional usage of staff PPE, and extensive cleaning of exam room while observing appropriate contact time as indicated for disinfecting solutions.   Problem List Items Addressed This Visit    ERECTILE DYSFUNCTION, NON-ORGANIC    Stable period on viagra - refilled.       Allergic rhinitis    Perennial - continue flonase, antihistamine.       Healthcare maintenance - Primary    Preventative protocols reviewed and updated unless pt declined. Discussed healthy diet and lifestyle.       Scheuermann's kyphosis   Skin lesion of back    Presumed epidermal cyst - will refer to dermatology for definitive treatment.       Relevant Orders   Ambulatory referral to Dermatology    Other Visit Diagnoses    Diabetes mellitus screening       Relevant Orders   Basic metabolic panel   Encounter for screening for HIV       Relevant Orders   HIV Antibody (routine testing w rflx)   Need for hepatitis C screening test       Relevant Orders   Hepatitis C antibody   Need for Tdap vaccination       Relevant Orders   Tdap vaccine greater than or equal to 7yo IM (Completed)       Meds ordered this encounter  Medications  . methocarbamol (ROBAXIN) 500 MG tablet    Sig: Take 1 tablet (500 mg total) by mouth every 8 (eight) hours as needed for muscle spasms (sedation precautions).    Dispense:  40 tablet    Refill:  3  . sildenafil (VIAGRA) 100 MG tablet    Sig: Take 0.5-1 tablets (50-100 mg total) by mouth as needed for erectile dysfunction.    Dispense:  8 tablet    Refill:  3    Orders Placed This Encounter  Procedures  . Tdap vaccine greater than or equal to 7yo IM  . Basic metabolic panel  . HIV Antibody (routine testing w rflx)  . Hepatitis C antibody  . Ambulatory referral to Dermatology    Referral Priority:   Routine    Referral Type:   Consultation    Referral Reason:   Specialty Services Required    Requested Specialty:   Dermatology    Number of Visits Requested:   1    Patient instructions: Tdap today  Labs today  We will refer you to dermatology for evaluation of spot on back - possible epidermal cyst You are doing well today  Return as needed or in 1 year for next physical.   Follow up plan: Return in about 1 year (around 04/13/2022), or if symptoms worsen or fail to improve, for annual exam, prior fasting for blood work.  Ria Bush, MD

## 2021-04-13 NOTE — Assessment & Plan Note (Signed)
Stable period on viagra - refilled.

## 2021-04-13 NOTE — Assessment & Plan Note (Signed)
Preventative protocols reviewed and updated unless pt declined. Discussed healthy diet and lifestyle.  

## 2021-04-13 NOTE — Assessment & Plan Note (Signed)
Perennial - continue flonase, antihistamine.

## 2021-04-13 NOTE — Patient Instructions (Addendum)
Tdap today  Labs today  We will refer you to dermatology for evaluation of spot on back - possible epidermal cyst You are doing well today  Return as needed or in 1 year for next physical.   Health Maintenance, Male Adopting a healthy lifestyle and getting preventive care are important in promoting health and wellness. Ask your health care provider about:  The right schedule for you to have regular tests and exams.  Things you can do on your own to prevent diseases and keep yourself healthy. What should I know about diet, weight, and exercise? Eat a healthy diet  Eat a diet that includes plenty of vegetables, fruits, low-fat dairy products, and lean protein.  Do not eat a lot of foods that are high in solid fats, added sugars, or sodium.   Maintain a healthy weight Body mass index (BMI) is a measurement that can be used to identify possible weight problems. It estimates body fat based on height and weight. Your health care provider can help determine your BMI and help you achieve or maintain a healthy weight. Get regular exercise Get regular exercise. This is one of the most important things you can do for your health. Most adults should:  Exercise for at least 150 minutes each week. The exercise should increase your heart rate and make you sweat (moderate-intensity exercise).  Do strengthening exercises at least twice a week. This is in addition to the moderate-intensity exercise.  Spend less time sitting. Even light physical activity can be beneficial. Watch cholesterol and blood lipids Have your blood tested for lipids and cholesterol at 42 years of age, then have this test every 5 years. You may need to have your cholesterol levels checked more often if:  Your lipid or cholesterol levels are high.  You are older than 42 years of age.  You are at high risk for heart disease. What should I know about cancer screening? Many types of cancers can be detected early and may often  be prevented. Depending on your health history and family history, you may need to have cancer screening at various ages. This may include screening for:  Colorectal cancer.  Prostate cancer.  Skin cancer.  Lung cancer. What should I know about heart disease, diabetes, and high blood pressure? Blood pressure and heart disease  High blood pressure causes heart disease and increases the risk of stroke. This is more likely to develop in people who have high blood pressure readings, are of African descent, or are overweight.  Talk with your health care provider about your target blood pressure readings.  Have your blood pressure checked: ? Every 3-5 years if you are 4-40 years of age. ? Every year if you are 34 years old or older.  If you are between the ages of 74 and 64 and are a current or former smoker, ask your health care provider if you should have a one-time screening for abdominal aortic aneurysm (AAA). Diabetes Have regular diabetes screenings. This checks your fasting blood sugar level. Have the screening done:  Once every three years after age 33 if you are at a normal weight and have a low risk for diabetes.  More often and at a younger age if you are overweight or have a high risk for diabetes. What should I know about preventing infection? Hepatitis B If you have a higher risk for hepatitis B, you should be screened for this virus. Talk with your health care provider to find out if you  are at risk for hepatitis B infection. Hepatitis C Blood testing is recommended for:  Everyone born from [birth year] through 1967.  Anyone with known risk factors for hepatitis C. Sexually transmitted infections (STIs)  You should be screened each year for STIs, including gonorrhea and chlamydia, if: ? You are sexually active and are younger than 42 years of age. ? You are older than 42 years of age and your health care provider tells you that you are at risk for this type of  infection. ? Your sexual activity has changed since you were last screened, and you are at increased risk for chlamydia or gonorrhea. Ask your health care provider if you are at risk.  Ask your health care provider about whether you are at high risk for HIV. Your health care provider may recommend a prescription medicine to help prevent HIV infection. If you choose to take medicine to prevent HIV, you should first get tested for HIV. You should then be tested every 3 months for as long as you are taking the medicine. Follow these instructions at home: Lifestyle  Do not use any products that contain nicotine or tobacco, such as cigarettes, e-cigarettes, and chewing tobacco. If you need help quitting, ask your health care provider.  Do not use street drugs.  Do not share needles.  Ask your health care provider for help if you need support or information about quitting drugs. Alcohol use  Do not drink alcohol if your health care provider tells you not to drink.  If you drink alcohol: ? Limit how much you have to 0-2 drinks a day. ? Be aware of how much alcohol is in your drink. In the [place] one drink equals one 12 oz bottle of beer (355 mL), one 5 oz glass of wine (148 mL), or one 1 oz glass of hard liquor (44 mL). General instructions  Schedule regular health, dental, and eye exams.  Stay current with your vaccines.  Tell your health care provider if: ? You often feel depressed. ? You have ever been abused or do not feel safe at home. Summary  Adopting a healthy lifestyle and getting preventive care are important in promoting health and wellness.  Follow your health care provider's instructions about healthy diet, exercising, and getting tested or screened for diseases.  Follow your health care provider's instructions on monitoring your cholesterol and blood pressure. This information is not intended to replace advice given to you by your health care provider. Make sure you  discuss any questions you have with your health care provider. Document Revised: 10/17/2018 Document Reviewed: 10/17/2018 Elsevier Patient Education  2021 [employer].

## 2021-04-13 NOTE — Assessment & Plan Note (Addendum)
Presumed epidermal cyst - will refer to dermatology for definitive treatment.

## 2021-04-14 LAB — HEPATITIS C ANTIBODY
Hepatitis C Ab: NONREACTIVE
SIGNAL TO CUT-OFF: 0.01 (ref <1.00)

## 2021-04-14 LAB — HIV ANTIBODY (ROUTINE TESTING W REFLEX): HIV 1&2 Ab, 4th Generation: NONREACTIVE

## 2022-04-09 ENCOUNTER — Other Ambulatory Visit: Payer: Self-pay | Admitting: Family Medicine

## 2022-04-09 DIAGNOSIS — Z13220 Encounter for screening for lipoid disorders: Secondary | ICD-10-CM

## 2022-04-09 DIAGNOSIS — Z131 Encounter for screening for diabetes mellitus: Secondary | ICD-10-CM

## 2022-04-11 ENCOUNTER — Other Ambulatory Visit: Payer: BC Managed Care – PPO

## 2022-04-18 ENCOUNTER — Encounter: Payer: Self-pay | Admitting: Family Medicine

## 2022-04-18 ENCOUNTER — Ambulatory Visit (INDEPENDENT_AMBULATORY_CARE_PROVIDER_SITE_OTHER): Payer: 59 | Admitting: Family Medicine

## 2022-04-18 VITALS — BP 118/78 | HR 55 | Temp 97.3°F | Ht 75.5 in | Wt 201.1 lb

## 2022-04-18 DIAGNOSIS — M4200 Juvenile osteochondrosis of spine, site unspecified: Secondary | ICD-10-CM | POA: Diagnosis not present

## 2022-04-18 DIAGNOSIS — D721 Eosinophilia, unspecified: Secondary | ICD-10-CM | POA: Insufficient documentation

## 2022-04-18 DIAGNOSIS — Z131 Encounter for screening for diabetes mellitus: Secondary | ICD-10-CM | POA: Diagnosis not present

## 2022-04-18 DIAGNOSIS — Z0000 Encounter for general adult medical examination without abnormal findings: Secondary | ICD-10-CM

## 2022-04-18 DIAGNOSIS — Z13220 Encounter for screening for lipoid disorders: Secondary | ICD-10-CM | POA: Diagnosis not present

## 2022-04-18 DIAGNOSIS — E041 Nontoxic single thyroid nodule: Secondary | ICD-10-CM

## 2022-04-18 DIAGNOSIS — J3089 Other allergic rhinitis: Secondary | ICD-10-CM

## 2022-04-18 DIAGNOSIS — N529 Male erectile dysfunction, unspecified: Secondary | ICD-10-CM

## 2022-04-18 LAB — TSH: TSH: 1 u[IU]/mL (ref 0.35–5.50)

## 2022-04-18 LAB — CBC WITH DIFFERENTIAL/PLATELET
Basophils Absolute: 0.1 10*3/uL (ref 0.0–0.1)
Basophils Relative: 1.9 % (ref 0.0–3.0)
Eosinophils Absolute: 0.1 10*3/uL (ref 0.0–0.7)
Eosinophils Relative: 3.2 % (ref 0.0–5.0)
HCT: 42.5 % (ref 39.0–52.0)
Hemoglobin: 14.4 g/dL (ref 13.0–17.0)
Lymphocytes Relative: 27.5 % (ref 12.0–46.0)
Lymphs Abs: 1.1 10*3/uL (ref 0.7–4.0)
MCHC: 33.8 g/dL (ref 30.0–36.0)
MCV: 93 fl (ref 78.0–100.0)
Monocytes Absolute: 0.4 10*3/uL (ref 0.1–1.0)
Monocytes Relative: 9.8 % (ref 3.0–12.0)
Neutro Abs: 2.4 10*3/uL (ref 1.4–7.7)
Neutrophils Relative %: 57.6 % (ref 43.0–77.0)
Platelets: 214 10*3/uL (ref 150.0–400.0)
RBC: 4.57 Mil/uL (ref 4.22–5.81)
RDW: 13.3 % (ref 11.5–15.5)
WBC: 4.1 10*3/uL (ref 4.0–10.5)

## 2022-04-18 LAB — COMPREHENSIVE METABOLIC PANEL
ALT: 14 U/L (ref 0–53)
AST: 20 U/L (ref 0–37)
Albumin: 4.6 g/dL (ref 3.5–5.2)
Alkaline Phosphatase: 44 U/L (ref 39–117)
BUN: 22 mg/dL (ref 6–23)
CO2: 29 mEq/L (ref 19–32)
Calcium: 9.4 mg/dL (ref 8.4–10.5)
Chloride: 102 mEq/L (ref 96–112)
Creatinine, Ser: 1.03 mg/dL (ref 0.40–1.50)
GFR: 89.07 mL/min (ref >60.00)
Glucose, Bld: 80 mg/dL (ref 70–99)
Potassium: 4.4 mEq/L (ref 3.5–5.1)
Sodium: 138 mEq/L (ref 135–145)
Total Bilirubin: 0.8 mg/dL (ref 0.2–1.2)
Total Protein: 6.8 g/dL (ref 6.0–8.3)

## 2022-04-18 LAB — LIPID PANEL
Cholesterol: 157 mg/dL (ref 0–200)
HDL: 54.5 mg/dL (ref >39.00)
LDL Cholesterol: 95 mg/dL (ref 0–99)
NonHDL: 102.54
Total CHOL/HDL Ratio: 3
Triglycerides: 40 mg/dL (ref 0.0–149.0)
VLDL: 8 mg/dL (ref 0.0–40.0)

## 2022-04-18 MED ORDER — METHOCARBAMOL 500 MG PO TABS: 500.0000 mg | ORAL_TABLET | Freq: Three times a day (TID) | ORAL | 3 refills | Status: DC | PRN

## 2022-04-18 MED ORDER — SILDENAFIL CITRATE 100 MG PO TABS: 50.0000 mg | ORAL_TABLET | ORAL | 3 refills | Status: DC | PRN

## 2022-04-18 NOTE — Assessment & Plan Note (Signed)
Perennial managed on flonase and allegra.

## 2022-04-18 NOTE — Assessment & Plan Note (Signed)
Stable period. Managed with prn robaxin and daily stretching. Update if worsening symptoms to update imaging.

## 2022-04-18 NOTE — Assessment & Plan Note (Signed)
Preventative protocols reviewed and updated unless pt declined. Discussed healthy diet and lifestyle.  

## 2022-04-18 NOTE — Assessment & Plan Note (Signed)
Stable on viagra - continue

## 2022-04-18 NOTE — Progress Notes (Signed)
Patient ID: Colton Rivera, male    DOB: Nov 21, 1980, 41 y.o.   MRN: [id number]  This visit was conducted in person.  BP 118/78   Pulse (!) 55   Temp (!) 97.3 F (36.3 C) (Temporal)   Ht 6' 3.5" (1.918 m)   Wt 201 lb 2 oz (91.2 kg)   SpO2 98%   BMI 24.81 kg/m   Vision Screening   Right eye Left eye Both eyes  Without correction '20/20 20/20 20/20 '$  With correction        CC: CPE Subjective:   HPI: Colton Rivera is a 41 y.o. male presenting on 04/18/2022 for Annual Exam   Moved to [place] last year.   Requests methocarbamol refilled. Ongoing chronic scapular dyskinesia has seen ortho, SM, PT, integrative therapies, etc. Finds daily warm up and stretches/massage helps. 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   H/o yearlong allergies.   Some R knee pain noted. Saw ortho 6 mo ago - reassuring eval.    Preventative: No fmhx prostate cancer. Aunt with colon cancer 22s.  Flu - yearly [place] 01/2020, 02/2020, booster 09/2020, 2nd as well Td 2011, Tdap 04/2021 Seat belt use discussed Sunscreen use discussed. No changing moles on skin.  Sleep - averaging 7 hours/night Non smoker Alcohol - 2-3 drinks a week  Rec drugs - none Dentist - q6 mo Eye exam - has not seen   Lives with wife, daughter (2011), dog Occupation: Software engineer  Activity: weight lifting, jiu jitsu, daily stretching  Diet: good water, daily fruits/vegetables.      Relevant past medical, surgical, family and social history reviewed and updated as indicated. Interim medical history since our last visit reviewed. Allergies and medications reviewed and updated. Outpatient Medications Prior to Visit  Medication Sig Dispense Refill   fexofenadine (ALLEGRA) 180 MG tablet Take 180 mg by mouth daily.     fluticasone (FLONASE) 50 MCG/ACT nasal spray USE 2 SPRAYS IN EACH NOSTRIL DAILY 16 g 3   Multiple Vitamin (MULTIVITAMIN) tablet  Take 1 tablet by mouth daily.     methocarbamol (ROBAXIN) 500 MG tablet Take 1 tablet (500 mg total) by mouth every 8 (eight) hours as needed for muscle spasms (sedation precautions). 40 tablet 3   sildenafil (VIAGRA) 100 MG tablet Take 0.5-1 tablets (50-100 mg total) by mouth as needed for erectile dysfunction. 8 tablet 3   No facility-administered medications prior to visit.     Per HPI unless specifically indicated in ROS section below Review of Systems  Constitutional:  Negative for activity change, appetite change, chills, fatigue, fever and unexpected weight change.  HENT:  Negative for hearing loss.   Eyes:  Negative for visual disturbance.  Respiratory:  Positive for cough (allergies). Negative for chest tightness, shortness of breath and wheezing.   Cardiovascular:  Negative for chest pain, palpitations and leg swelling.  Gastrointestinal:  Negative for abdominal distention, abdominal pain, blood in stool, constipation, diarrhea, nausea and vomiting.  Genitourinary:  Negative for difficulty urinating and hematuria.  Musculoskeletal:  Negative for arthralgias, myalgias and neck pain.  Skin:  Negative for rash.  Neurological:  Negative for dizziness, seizures, syncope and headaches.  Hematological:  Negative for adenopathy. Does not bruise/bleed easily.  Psychiatric/Behavioral:  Negative for dysphoric mood. The patient is not nervous/anxious.     Objective:  BP 118/78   Pulse (!) 55   Temp (!) 97.3 F (36.3 C) (Temporal)  Ht 6' 3.5" (1.918 m)   Wt 201 lb 2 oz (91.2 kg)   SpO2 98%   BMI 24.81 kg/m   Wt Readings from Last 3 Encounters:  04/18/22 201 lb 2 oz (91.2 kg)  04/13/21 199 lb 6 oz (90.4 kg)  01/08/19 208 lb (94.3 kg)      Physical Exam Vitals and nursing note reviewed.  Constitutional:      General: He is not in acute distress.    Appearance: Normal appearance. He is well-developed. He is not ill-appearing.  HENT:     Head: Normocephalic and atraumatic.      Right Ear: Hearing, tympanic membrane, ear canal and external ear normal.     Left Ear: Hearing, tympanic membrane, ear canal and external ear normal.  Eyes:     General: No scleral icterus.    Extraocular Movements: Extraocular movements intact.     Conjunctiva/sclera: Conjunctivae normal.     Pupils: Pupils are equal, round, and reactive to light.  Neck:     Thyroid: No thyroid mass or thyromegaly.     Comments: Possible tiny R sided nodule Cardiovascular:     Rate and Rhythm: Normal rate and regular rhythm.     Pulses: Normal pulses.          Radial pulses are 2+ on the right side and 2+ on the left side.     Heart sounds: Normal heart sounds. No murmur heard. Pulmonary:     Effort: Pulmonary effort is normal. No respiratory distress.     Breath sounds: Normal breath sounds. No wheezing, rhonchi or rales.  Abdominal:     General: Bowel sounds are normal. There is no distension.     Palpations: Abdomen is soft. There is no mass.     Tenderness: There is no abdominal tenderness. There is no guarding or rebound.     Hernia: No hernia is present.  Musculoskeletal:        General: Normal range of motion.     Cervical back: Normal range of motion and neck supple.     Right lower leg: No edema.     Left lower leg: No edema.  Lymphadenopathy:     Cervical: No cervical adenopathy.  Skin:    General: Skin is warm and dry.     Findings: No rash.  Neurological:     General: No focal deficit present.     Mental Status: He is alert and oriented to person, place, and time.  Psychiatric:        Mood and Affect: Mood normal.        Behavior: Behavior normal.        Thought Content: Thought content normal.        Judgment: Judgment normal.       Results for orders placed or performed in visit on 68/12/75  Basic metabolic panel  Result Value Ref Range   Sodium 139 135 - 145 mEq/L   Potassium 4.8 3.5 - 5.1 mEq/L   Chloride 104 96 - 112 mEq/L   CO2 27 19 - 32 mEq/L   Glucose, Bld 89  70 - 99 mg/dL   BUN 18 6 - 23 mg/dL   Creatinine, Ser 1.16 0.40 - 1.50 mg/dL   GFR 77.78 >60.00 mL/min   Calcium 9.8 8.4 - 10.5 mg/dL  HIV Antibody (routine testing w rflx)  Result Value Ref Range   HIV 1&2 Ab, 4th Generation NON-REACTIVE NON-REACTIVE  Hepatitis C antibody  Result Value Ref  Range   Hepatitis C Ab NON-REACTIVE NON-REACTIVE   SIGNAL TO CUT-OFF 0.01 <1.00    Assessment & Plan:   Problem List Items Addressed This Visit     Healthcare maintenance - Primary (Chronic)    Preventative protocols reviewed and updated unless pt declined. Discussed healthy diet and lifestyle.       ED (erectile dysfunction)    Stable on viagra - continue       Allergic rhinitis    Perennial managed on flonase and allegra.       Scheuermann's kyphosis    Stable period. Managed with prn robaxin and daily stretching. Update if worsening symptoms to update imaging.       Eosinophilia    Update CBC. Anticipate related to allergies.       Relevant Orders   CBC with Differential/Platelet   Other Visit Diagnoses     Thyroid nodule       Relevant Orders   TSH   Lipid screening       Relevant Orders   Lipid panel   Diabetes mellitus screening       Relevant Orders   Comprehensive metabolic panel        Meds ordered this encounter  Medications   methocarbamol (ROBAXIN) 500 MG tablet    Sig: Take 1 tablet (500 mg total) by mouth every 8 (eight) hours as needed for muscle spasms (sedation precautions).    Dispense:  40 tablet    Refill:  3   sildenafil (VIAGRA) 100 MG tablet    Sig: Take 0.5-1 tablets (50-100 mg total) by mouth as needed for erectile dysfunction.    Dispense:  8 tablet    Refill:  3   Orders Placed This Encounter  Procedures   Lipid panel   Comprehensive metabolic panel   TSH   CBC with Differential/Platelet    Patient instructions: Labs today Vision screen today.  You are doing well today. Return as needed or in 1-2 years for next physical.    Follow up plan: Return in about 1 year (around 04/19/2023) for annual exam, prior fasting for blood work.  Ria Bush, MD

## 2022-04-18 NOTE — Assessment & Plan Note (Signed)
Update CBC. Anticipate related to allergies.

## 2022-04-18 NOTE — Patient Instructions (Addendum)
Labs today Vision screen today.  You are doing well today. Return as needed or in 1-2 years for next physical.   Health Maintenance, Male Adopting a healthy lifestyle and getting preventive care are important in promoting health and wellness. Ask your health care provider about: The right schedule for you to have regular tests and exams. Things you can do on your own to prevent diseases and keep yourself healthy. What should I know about diet, weight, and exercise? Eat a healthy diet  Eat a diet that includes plenty of vegetables, fruits, low-fat dairy products, and lean protein. Do not eat a lot of foods that are high in solid fats, added sugars, or sodium. Maintain a healthy weight Body mass index (BMI) is a measurement that can be used to identify possible weight problems. It estimates body fat based on height and weight. Your health care provider can help determine your BMI and help you achieve or maintain a healthy weight. Get regular exercise Get regular exercise. This is one of the most important things you can do for your health. Most adults should: Exercise for at least 150 minutes each week. The exercise should increase your heart rate and make you sweat (moderate-intensity exercise). Do strengthening exercises at least twice a week. This is in addition to the moderate-intensity exercise. Spend less time sitting. Even light physical activity can be beneficial. Watch cholesterol and blood lipids Have your blood tested for lipids and cholesterol at 43 years of age, then have this test every 5 years. You may need to have your cholesterol levels checked more often if: Your lipid or cholesterol levels are high. You are older than 43 years of age. You are at high risk for heart disease. What should I know about cancer screening? Many types of cancers can be detected early and may often be prevented. Depending on your health history and family history, you may need to have cancer  screening at various ages. This may include screening for: Colorectal cancer. Prostate cancer. Skin cancer. Lung cancer. What should I know about heart disease, diabetes, and high blood pressure? Blood pressure and heart disease High blood pressure causes heart disease and increases the risk of stroke. This is more likely to develop in people who have high blood pressure readings or are overweight. Talk with your health care provider about your target blood pressure readings. Have your blood pressure checked: Every 3-5 years if you are 82-40 years of age. Every year if you are 53 years old or older. If you are between the ages of 24 and 81 and are a current or former smoker, ask your health care provider if you should have a one-time screening for abdominal aortic aneurysm (AAA). Diabetes Have regular diabetes screenings. This checks your fasting blood sugar level. Have the screening done: Once every three years after age 58 if you are at a normal weight and have a low risk for diabetes. More often and at a younger age if you are overweight or have a high risk for diabetes. What should I know about preventing infection? Hepatitis B If you have a higher risk for hepatitis B, you should be screened for this virus. Talk with your health care provider to find out if you are at risk for hepatitis B infection. Hepatitis C Blood testing is recommended for: Everyone born from [birth year] through 1967. Anyone with known risk factors for hepatitis C. Sexually transmitted infections (STIs) You should be screened each year for STIs, including gonorrhea and chlamydia,  if: You are sexually active and are younger than 43 years of age. You are older than 42 years of age and your health care provider tells you that you are at risk for this type of infection. Your sexual activity has changed since you were last screened, and you are at increased risk for chlamydia or gonorrhea. Ask your health care provider if  you are at risk. Ask your health care provider about whether you are at high risk for HIV. Your health care provider may recommend a prescription medicine to help prevent HIV infection. If you choose to take medicine to prevent HIV, you should first get tested for HIV. You should then be tested every 3 months for as long as you are taking the medicine. Follow these instructions at home: Alcohol use Do not drink alcohol if your health care provider tells you not to drink. If you drink alcohol: Limit how much you have to 0-2 drinks a day. Know how much alcohol is in your drink. In the [place] one drink equals one 12 oz bottle of beer (355 mL), one 5 oz glass of wine (148 mL), or one 1 oz glass of hard liquor (44 mL). Lifestyle Do not use any products that contain nicotine or tobacco. These products include cigarettes, chewing tobacco, and vaping devices, such as e-cigarettes. If you need help quitting, ask your health care provider. Do not use street drugs. Do not share needles. Ask your health care provider for help if you need support or information about quitting drugs. General instructions Schedule regular health, dental, and eye exams. Stay current with your vaccines. Tell your health care provider if: You often feel depressed. You have ever been abused or do not feel safe at home. Summary Adopting a healthy lifestyle and getting preventive care are important in promoting health and wellness. Follow your health care provider's instructions about healthy diet, exercising, and getting tested or screened for diseases. Follow your health care provider's instructions on monitoring your cholesterol and blood pressure. This information is not intended to replace advice given to you by your health care provider. Make sure you discuss any questions you have with your health care provider. Document Revised: 03/15/2021 Document Reviewed: 03/15/2021 Elsevier Patient Education  [place].

## 2023-04-09 ENCOUNTER — Other Ambulatory Visit: Payer: Self-pay | Admitting: Family Medicine

## 2023-04-09 DIAGNOSIS — N400 Enlarged prostate without lower urinary tract symptoms: Secondary | ICD-10-CM

## 2023-04-09 DIAGNOSIS — Z131 Encounter for screening for diabetes mellitus: Secondary | ICD-10-CM

## 2023-04-09 DIAGNOSIS — D721 Eosinophilia, unspecified: Secondary | ICD-10-CM

## 2023-04-14 ENCOUNTER — Other Ambulatory Visit: Payer: Self-pay

## 2023-04-21 ENCOUNTER — Encounter: Payer: 59 | Admitting: Family Medicine

## 2023-05-02 ENCOUNTER — Encounter: Payer: Self-pay | Admitting: Family Medicine

## 2023-08-23 ENCOUNTER — Telehealth: Payer: Self-pay

## 2023-08-23 ENCOUNTER — Ambulatory Visit: Payer: BC Managed Care – PPO | Admitting: Family Medicine

## 2023-08-23 ENCOUNTER — Encounter: Payer: Self-pay | Admitting: Family Medicine

## 2023-08-23 VITALS — BP 122/72 | HR 58 | Temp 98.0°F | Ht 75.75 in | Wt 209.0 lb

## 2023-08-23 DIAGNOSIS — Z0000 Encounter for general adult medical examination without abnormal findings: Secondary | ICD-10-CM

## 2023-08-23 DIAGNOSIS — J3089 Other allergic rhinitis: Secondary | ICD-10-CM

## 2023-08-23 DIAGNOSIS — Z23 Encounter for immunization: Secondary | ICD-10-CM | POA: Diagnosis not present

## 2023-08-23 DIAGNOSIS — N529 Male erectile dysfunction, unspecified: Secondary | ICD-10-CM

## 2023-08-23 MED ORDER — SILDENAFIL CITRATE 100 MG PO TABS
50.0000 mg | ORAL_TABLET | Freq: Every day | ORAL | 11 refills | Status: DC | PRN
Start: 2023-08-23 — End: 2024-09-27

## 2023-08-23 MED ORDER — FLUTICASONE PROPIONATE 50 MCG/ACT NA SUSP
2.0000 | Freq: Every day | NASAL | 12 refills | Status: AC
Start: 2023-08-23 — End: ?

## 2023-08-23 MED ORDER — SILDENAFIL CITRATE 100 MG PO TABS
50.0000 mg | ORAL_TABLET | ORAL | 12 refills | Status: DC | PRN
Start: 2023-08-23 — End: 2023-08-23

## 2023-08-23 MED ORDER — METHOCARBAMOL 500 MG PO TABS: 500.0000 mg | ORAL_TABLET | Freq: Two times a day (BID) | ORAL | 3 refills | Status: DC | PRN

## 2023-08-23 NOTE — Patient Instructions (Signed)
Flu shot today  Send me mychart message reminder sometime next year for colonoscopy referral in [facility].  You are doing well today Return as needed or in 1-2 years for next physical

## 2023-08-23 NOTE — Assessment & Plan Note (Addendum)
Preventative protocols reviewed and updated unless pt declined. Discussed healthy diet and lifestyle.  Reviewed reassuring labs from 04/2022. Defer today, recheck next year.

## 2023-08-23 NOTE — Telephone Encounter (Signed)
What medicine was this for?

## 2023-08-23 NOTE — Addendum Note (Signed)
Addended by: Nanci Pina on: 08/23/2023 03:32 PM   Modules accepted: Orders

## 2023-08-23 NOTE — Telephone Encounter (Signed)
New Rx sent in

## 2023-08-23 NOTE — Telephone Encounter (Signed)
My apologies, sildenafil.

## 2023-08-23 NOTE — Addendum Note (Signed)
Addended by: Eustaquio Boyden on: 08/23/2023 04:49 PM   Modules accepted: Orders

## 2023-08-23 NOTE — Assessment & Plan Note (Addendum)
Continues PRN viagra, tolerating well, takes 1/2 tab at a time.

## 2023-08-23 NOTE — Telephone Encounter (Signed)
Received faxed message from [facility], [place], [place] requesting clarification of max daily dosage on rx and resend.

## 2023-08-23 NOTE — Progress Notes (Signed)
Ph: [phone] Fax: [phone]   Patient ID: [name], male    DOB: [birth year], 42 y.o.   MRN: [id number]  This visit was conducted in person.  BP 122/72   Pulse (!) 58   Temp 98 F (36.7 C) (Oral)   Ht 6' 3.75" (1.924 m)   Wt 209 lb (94.8 kg)   SpO2 100%   BMI 25.61 kg/m    CC: CPE Subjective:   HPI: Colton Rivera is a 42 y.o. male presenting on 08/23/2023 for Annual Exam   Moved to [place] last year.   H/o chronic scapular dyskinesia has seen ortho, SM, PT, integrative therapies, etc. Finds daily warm up and stretches/massage helps. 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  Saw ortho for R knee chondromalacia.   H/o yearlong allergies managed with allegra and flonase.   ED - managed with PRN viagra. No HA, priapism, indigestion. No chest pain  Preventative: Colon cancer screening - not yet due. Discussed options and recommendation to start at age 71 - would want colonoscopy. Aunt with colon cancer 60s.  No fmhx prostate cancer.  Flu - yearly COVID vaccine Pfizer 01/2020, 02/2020, booster 09/2020, 2nd as well Td 2011, Tdap 04/2021 Seat belt use discussed Sunscreen use discussed. No changing moles on skin.  Sleep - averaging 6-7 hours/night  Non smoker Alcohol - 3 drinks a week  Dentist - q6 mo Eye exam - has not seen   Lives with wife, daughter (2011), dog Occupation: Radio broadcast assistant for [employer] Activity: walks dog, weight lifting, coaches softball Diet: good water, daily fruits/vegetables      Relevant past medical, surgical, family and social history reviewed and updated as indicated. Interim medical history since our last visit reviewed. Allergies and medications reviewed and updated. Outpatient Medications Prior to Visit  Medication Sig Dispense Refill   fexofenadine (ALLEGRA) 180 MG tablet Take 180 mg by mouth daily.     Multiple Vitamin (MULTIVITAMIN)  tablet Take 1 tablet by mouth daily.     fluticasone (FLONASE) 50 MCG/ACT nasal spray USE 2 SPRAYS IN EACH NOSTRIL DAILY 16 g 3   methocarbamol (ROBAXIN) 500 MG tablet Take 1 tablet (500 mg total) by mouth every 8 (eight) hours as needed for muscle spasms (sedation precautions). 40 tablet 3   sildenafil (VIAGRA) 100 MG tablet Take 0.5-1 tablets (50-100 mg total) by mouth as needed for erectile dysfunction. 8 tablet 3   No facility-administered medications prior to visit.     Per HPI unless specifically indicated in ROS section below Review of Systems  Objective:  BP 122/72   Pulse (!) 58   Temp 98 F (36.7 C) (Oral)   Ht 6' 3.75" (1.924 m)   Wt 209 lb (94.8 kg)   SpO2 100%   BMI 25.61 kg/m   Wt Readings from Last 3 Encounters:  08/23/23 209 lb (94.8 kg)  04/18/22 201 lb 2 oz (91.2 kg)  04/13/21 199 lb 6 oz (90.4 kg)      Physical Exam Vitals and nursing note reviewed.  Constitutional:      General: He is not in acute distress.    Appearance: Normal appearance. He is well-developed. He is not ill-appearing.  HENT:     Head: Normocephalic and atraumatic.     Right Ear: Hearing, tympanic membrane, ear canal and external ear normal.     Left Ear: Hearing, tympanic membrane, ear canal and external ear normal.  Mouth/Throat:     Mouth: Mucous membranes are moist.     Pharynx: Oropharynx is clear. No oropharyngeal exudate or posterior oropharyngeal erythema.  Eyes:     General: No scleral icterus.    Extraocular Movements: Extraocular movements intact.     Conjunctiva/sclera: Conjunctivae normal.     Pupils: Pupils are equal, round, and reactive to light.  Neck:     Thyroid: No thyroid mass or thyromegaly.  Cardiovascular:     Rate and Rhythm: Normal rate and regular rhythm.     Pulses: Normal pulses.          Radial pulses are 2+ on the right side and 2+ on the left side.     Heart sounds: Normal heart sounds. No murmur heard. Pulmonary:     Effort: Pulmonary effort  is normal. No respiratory distress.     Breath sounds: Normal breath sounds. No wheezing, rhonchi or rales.  Abdominal:     General: Bowel sounds are normal. There is no distension.     Palpations: Abdomen is soft. There is no mass.     Tenderness: There is no abdominal tenderness. There is no guarding or rebound.     Hernia: No hernia is present.  Musculoskeletal:        General: Normal range of motion.     Cervical back: Normal range of motion and neck supple.     Right lower leg: No edema.     Left lower leg: No edema.  Lymphadenopathy:     Cervical: No cervical adenopathy.  Skin:    General: Skin is warm and dry.     Findings: No rash.  Neurological:     General: No focal deficit present.     Mental Status: He is alert and oriented to person, place, and time.  Psychiatric:        Mood and Affect: Mood normal.        Behavior: Behavior normal.        Thought Content: Thought content normal.        Judgment: Judgment normal.        Assessment & Plan:   Problem List Items Addressed This Visit     Healthcare maintenance - Primary (Chronic)    Preventative protocols reviewed and updated unless pt declined. Discussed healthy diet and lifestyle.  Reviewed reassuring labs from 04/2022. Defer today, recheck next year.        ED (erectile dysfunction)    Continues PRN viagra, tolerating well, takes 1/2 tab at a time.       Relevant Medications   sildenafil (VIAGRA) 100 MG tablet   Allergic rhinitis    Continue allegra and flonase. Discussed optimal flonase use/administration.  No epistaxis.       Relevant Medications   fluticasone (FLONASE) 50 MCG/ACT nasal spray   Other Visit Diagnoses     Encounter for immunization       Relevant Orders   Flu vaccine trivalent PF, 6mos and older(Flulaval,Afluria,Fluarix,Fluzone) (Completed)        Meds ordered this encounter  Medications   fluticasone (FLONASE) 50 MCG/ACT nasal spray    Sig: Place 2 sprays into both  nostrils daily.    Dispense:  16 g    Refill:  12   sildenafil (VIAGRA) 100 MG tablet    Sig: Take 0.5-1 tablets (50-100 mg total) by mouth as needed for erectile dysfunction.    Dispense:  8 tablet    Refill:  12   methocarbamol (  ROBAXIN) 500 MG tablet    Sig: Take 1 tablet (500 mg total) by mouth 2 (two) times daily as needed for muscle spasms (sedation precautions).    Dispense:  40 tablet    Refill:  3    Orders Placed This Encounter  Procedures   Flu vaccine trivalent PF, 6mos and older(Flulaval,Afluria,Fluarix,Fluzone)    Patient Instructions  Flu shot today  Send me mychart message reminder sometime next year for colonoscopy referral in [facility].  You are doing well today Return as needed or in 1-2 years for next physical  Follow up plan: Return in about 1 year (around 08/22/2024) for annual exam, prior fasting for blood work.  Eustaquio Boyden, MD

## 2023-08-23 NOTE — Assessment & Plan Note (Signed)
Continue allegra and flonase. Discussed optimal flonase use/administration.  No epistaxis.

## 2024-08-29 ENCOUNTER — Encounter: Payer: Self-pay | Admitting: Family Medicine

## 2024-09-27 ENCOUNTER — Other Ambulatory Visit: Payer: Self-pay

## 2024-09-27 DIAGNOSIS — N529 Male erectile dysfunction, unspecified: Secondary | ICD-10-CM

## 2024-09-27 NOTE — Telephone Encounter (Signed)
 Have sent message to patient to call due for CPE has not called to schedule ok to fill as pended

## 2024-09-28 MED ORDER — SILDENAFIL CITRATE 100 MG PO TABS: 50.0000 mg | ORAL_TABLET | Freq: Every day | ORAL | 1 refills | Status: DC | PRN

## 2024-09-28 NOTE — Telephone Encounter (Signed)
 ERx

## 2024-11-25 ENCOUNTER — Encounter: Payer: Self-pay | Admitting: Family Medicine

## 2024-11-25 ENCOUNTER — Ambulatory Visit (INDEPENDENT_AMBULATORY_CARE_PROVIDER_SITE_OTHER): Admitting: Family Medicine

## 2024-11-25 VITALS — BP 120/76 | HR 53 | Temp 97.6°F | Ht 75.75 in | Wt 224.4 lb

## 2024-11-25 DIAGNOSIS — Z131 Encounter for screening for diabetes mellitus: Secondary | ICD-10-CM | POA: Diagnosis not present

## 2024-11-25 DIAGNOSIS — Z136 Encounter for screening for cardiovascular disorders: Secondary | ICD-10-CM

## 2024-11-25 DIAGNOSIS — Z13220 Encounter for screening for lipoid disorders: Secondary | ICD-10-CM

## 2024-11-25 DIAGNOSIS — Z0000 Encounter for general adult medical examination without abnormal findings: Secondary | ICD-10-CM

## 2024-11-25 DIAGNOSIS — Z23 Encounter for immunization: Secondary | ICD-10-CM

## 2024-11-25 DIAGNOSIS — Z1211 Encounter for screening for malignant neoplasm of colon: Secondary | ICD-10-CM

## 2024-11-25 DIAGNOSIS — N529 Male erectile dysfunction, unspecified: Secondary | ICD-10-CM | POA: Diagnosis not present

## 2024-11-25 DIAGNOSIS — D721 Eosinophilia, unspecified: Secondary | ICD-10-CM | POA: Diagnosis not present

## 2024-11-25 LAB — BASIC METABOLIC PANEL WITH GFR
BUN: 15 mg/dL (ref 6–23)
CO2: 30 meq/L (ref 19–32)
Calcium: 9.2 mg/dL (ref 8.4–10.5)
Chloride: 103 meq/L (ref 96–112)
Creatinine, Ser: 1.09 mg/dL (ref 0.40–1.50)
GFR: 81.71 mL/min
Glucose, Bld: 86 mg/dL (ref 70–99)
Potassium: 4.3 meq/L (ref 3.5–5.1)
Sodium: 138 meq/L (ref 135–145)

## 2024-11-25 LAB — CBC WITH DIFFERENTIAL/PLATELET
Basophils Absolute: 0.1 K/uL (ref 0.0–0.1)
Basophils Relative: 1.2 % (ref 0.0–3.0)
Eosinophils Absolute: 0.1 K/uL (ref 0.0–0.7)
Eosinophils Relative: 3.1 % (ref 0.0–5.0)
HCT: 43.2 % (ref 39.0–52.0)
Hemoglobin: 14.9 g/dL (ref 13.0–17.0)
Lymphocytes Relative: 36.5 % (ref 12.0–46.0)
Lymphs Abs: 1.6 K/uL (ref 0.7–4.0)
MCHC: 34.5 g/dL (ref 30.0–36.0)
MCV: 90.8 fl (ref 78.0–100.0)
Monocytes Absolute: 0.3 K/uL (ref 0.1–1.0)
Monocytes Relative: 7.6 % (ref 3.0–12.0)
Neutro Abs: 2.3 K/uL (ref 1.4–7.7)
Neutrophils Relative %: 51.6 % (ref 43.0–77.0)
Platelets: 239 K/uL (ref 150.0–400.0)
RBC: 4.76 Mil/uL (ref 4.22–5.81)
RDW: 12.7 % (ref 11.5–15.5)
WBC: 4.4 K/uL (ref 4.0–10.5)

## 2024-11-25 LAB — LIPID PANEL
Cholesterol: 165 mg/dL (ref 28–200)
HDL: 45.4 mg/dL
LDL Cholesterol: 111 mg/dL — ABNORMAL HIGH (ref 10–99)
NonHDL: 119.71
Total CHOL/HDL Ratio: 4
Triglycerides: 43 mg/dL (ref 10.0–149.0)
VLDL: 8.6 mg/dL (ref 0.0–40.0)

## 2024-11-25 MED ORDER — SILDENAFIL CITRATE 100 MG PO TABS: 50.0000 mg | ORAL_TABLET | Freq: Every day | ORAL | 11 refills | Status: AC | PRN

## 2024-11-25 MED ORDER — METHOCARBAMOL 500 MG PO TABS: 500.0000 mg | ORAL_TABLET | Freq: Two times a day (BID) | ORAL | 1 refills | Status: AC | PRN

## 2024-11-25 NOTE — Assessment & Plan Note (Signed)
 Viagra  refilled.

## 2024-11-25 NOTE — Progress Notes (Signed)
 " Ph: [phone] Fax: [phone]   Patient ID: Colton Rivera, male    DOB: [birth year], 44 y.o.   MRN: [id number]  This visit was conducted in person.  BP 120/76 (BP Location: Left Arm, Patient Position: Sitting, Cuff Size: Normal)   Pulse (!) 53   Temp 97.6 F (36.4 C) (Oral)   Ht 6' 3.75 (1.924 m)   Wt 224 lb 6.4 oz (101.8 kg)   SpO2 99%   BMI 27.50 kg/m   Pulse Readings from Last 3 Encounters:  11/25/24 (!) 53  08/23/23 (!) 58  04/18/22 (!) 55    CC: CPE Subjective:   HPI: Colton Rivera is a 44 y.o. male presenting on 11/25/2024 for Annual Exam (Pt wants to discuss Colonoscopy procedure //Would like flu shot today)   Lives in [place]   H/o chronic scapular dyskinesia has seen ortho, SM, PT, integrative therapies, etc. Finds daily warm up and stretches/massage helps. 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   H/o yearlong allergies managed with allegra and flonase .  ED - managed with PRN viagra  50mg .   Preventative: Colon cancer screening - refer for colonoscopy, requests closer to [place] Aunt with colon cancer 60s.  No fmhx prostate cancer.  Flu - yearly COVID vaccine Pfizer 01/2020, 02/2020, booster 09/2020, 2nd as well Td 2011, Tdap 04/2021 Seat belt use discussed Sunscreen use discussed. No changing moles on skin.  Sleep - averaging 7 hours/night  Non smoker Alcohol - 3 drinks a week  Dentist - q6 mo Eye exam - 2025 Bowel - no constipation   Lives with wife, daughter (2011), dog Occupation: Radio broadcast assistant for [employer] Activity: weight lifting, coaches softball, walking the dog  Diet: good water, daily fruits/vegetables      Relevant past medical, surgical, family and social history reviewed and updated as indicated. Interim medical history since our last visit reviewed. Allergies and medications reviewed and updated. Outpatient Medications Prior to Visit   Medication Sig Dispense Refill   fexofenadine (ALLEGRA) 180 MG tablet Take 180 mg by mouth daily.     fluticasone  (FLONASE ) 50 MCG/ACT nasal spray Place 2 sprays into both nostrils daily. 16 g 12   Multiple Vitamin (MULTIVITAMIN) tablet Take 1 tablet by mouth daily.     methocarbamol  (ROBAXIN ) 500 MG tablet Take 1 tablet (500 mg total) by mouth 2 (two) times daily as needed for muscle spasms (sedation precautions). 40 tablet 3   sildenafil  (VIAGRA ) 100 MG tablet Take 0.5-1 tablets (50-100 mg total) by mouth daily as needed for erectile dysfunction. 10 tablet 1   No facility-administered medications prior to visit.     Per HPI unless specifically indicated in ROS section below Review of Systems  Constitutional:  Negative for activity change, appetite change, chills, fatigue, fever and unexpected weight change.  HENT:  Negative for hearing loss.   Eyes:  Negative for visual disturbance.  Respiratory:  Negative for cough, chest tightness, shortness of breath and wheezing.   Cardiovascular:  Negative for chest pain, palpitations and leg swelling.  Gastrointestinal:  Negative for abdominal distention, abdominal pain, blood in stool, constipation, diarrhea, nausea and vomiting.  Genitourinary:  Negative for difficulty urinating and hematuria.  Musculoskeletal:  Negative for arthralgias, myalgias and neck pain.  Skin:  Negative for rash.  Neurological:  Negative for dizziness, seizures, syncope and headaches.  Hematological:  Negative for adenopathy. Does not bruise/bleed easily.  Psychiatric/Behavioral:  Negative for dysphoric mood.  The patient is not nervous/anxious.     Objective:  BP 120/76 (BP Location: Left Arm, Patient Position: Sitting, Cuff Size: Normal)   Pulse (!) 53   Temp 97.6 F (36.4 C) (Oral)   Ht 6' 3.75 (1.924 m)   Wt 224 lb 6.4 oz (101.8 kg)   SpO2 99%   BMI 27.50 kg/m   Wt Readings from Last 3 Encounters:  11/25/24 224 lb 6.4 oz (101.8 kg)  08/23/23 209 lb (94.8  kg)  04/18/22 201 lb 2 oz (91.2 kg)      Physical Exam Vitals and nursing note reviewed.  Constitutional:      General: He is not in acute distress.    Appearance: Normal appearance. He is well-developed. He is not ill-appearing.  HENT:     Head: Normocephalic and atraumatic.     Right Ear: Hearing, tympanic membrane, ear canal and external ear normal.     Left Ear: Hearing, tympanic membrane, ear canal and external ear normal.     Mouth/Throat:     Mouth: Mucous membranes are moist.     Pharynx: Oropharynx is clear. No oropharyngeal exudate or posterior oropharyngeal erythema.  Eyes:     General: No scleral icterus.    Extraocular Movements: Extraocular movements intact.     Conjunctiva/sclera: Conjunctivae normal.     Pupils: Pupils are equal, round, and reactive to light.  Neck:     Thyroid: No thyroid mass or thyromegaly.  Cardiovascular:     Rate and Rhythm: Normal rate and regular rhythm.     Pulses: Normal pulses.          Radial pulses are 2+ on the right side and 2+ on the left side.     Heart sounds: Normal heart sounds. No murmur heard. Pulmonary:     Effort: Pulmonary effort is normal. No respiratory distress.     Breath sounds: Normal breath sounds. No wheezing, rhonchi or rales.  Abdominal:     General: Bowel sounds are normal. There is no distension.     Palpations: Abdomen is soft. There is no mass.     Tenderness: There is no abdominal tenderness. There is no guarding or rebound.     Hernia: No hernia is present.  Musculoskeletal:        General: Normal range of motion.     Cervical back: Normal range of motion and neck supple.     Right lower leg: No edema.     Left lower leg: No edema.  Lymphadenopathy:     Cervical: No cervical adenopathy.  Skin:    General: Skin is warm and dry.     Findings: No rash.  Neurological:     General: No focal deficit present.     Mental Status: He is alert and oriented to person, place, and time.  Psychiatric:         Mood and Affect: Mood normal.        Behavior: Behavior normal.        Thought Content: Thought content normal.        Judgment: Judgment normal.       Results for orders placed or performed in visit on 04/18/22  Lipid panel   Collection Time: 04/18/22 10:08 AM  Result Value Ref Range   Cholesterol 157 0 - 200 mg/dL   Triglycerides 59.9 0.0 - 149.0 mg/dL   HDL 45.49 >60.99 mg/dL   VLDL 8.0 0.0 - 59.9 mg/dL   LDL Cholesterol 95 0 -  99 mg/dL   Total CHOL/HDL Ratio 3    NonHDL 102.54   Comprehensive metabolic panel   Collection Time: 04/18/22 10:08 AM  Result Value Ref Range   Sodium 138 135 - 145 mEq/L   Potassium 4.4 3.5 - 5.1 mEq/L   Chloride 102 96 - 112 mEq/L   CO2 29 19 - 32 mEq/L   Glucose, Bld 80 70 - 99 mg/dL   BUN 22 6 - 23 mg/dL   Creatinine, Ser 8.96 0.40 - 1.50 mg/dL   Total Bilirubin 0.8 0.2 - 1.2 mg/dL   Alkaline Phosphatase 44 39 - 117 U/L   AST 20 0 - 37 U/L   ALT 14 0 - 53 U/L   Total Protein 6.8 6.0 - 8.3 g/dL   Albumin 4.6 3.5 - 5.2 g/dL   GFR 10.92 >39.99 mL/min   Calcium 9.4 8.4 - 10.5 mg/dL  TSH   Collection Time: 04/18/22 10:08 AM  Result Value Ref Range   TSH 1.00 0.35 - 5.50 uIU/mL  CBC with Differential/Platelet   Collection Time: 04/18/22 10:08 AM  Result Value Ref Range   WBC 4.1 4.0 - 10.5 K/uL   RBC 4.57 4.22 - 5.81 Mil/uL   Hemoglobin 14.4 13.0 - 17.0 g/dL   HCT 57.4 60.9 - 47.9 %   MCV 93.0 78.0 - 100.0 fl   MCHC 33.8 30.0 - 36.0 g/dL   RDW 86.6 88.4 - 84.4 %   Platelets 214.0 150.0 - 400.0 K/uL   Neutrophils Relative % 57.6 43.0 - 77.0 %   Lymphocytes Relative 27.5 12.0 - 46.0 %   Monocytes Relative 9.8 3.0 - 12.0 %   Eosinophils Relative 3.2 0.0 - 5.0 %   Basophils Relative 1.9 0.0 - 3.0 %   Neutro Abs 2.4 1.4 - 7.7 K/uL   Lymphs Abs 1.1 0.7 - 4.0 K/uL   Monocytes Absolute 0.4 0.1 - 1.0 K/uL   Eosinophils Absolute 0.1 0.0 - 0.7 K/uL   Basophils Absolute 0.1 0.0 - 0.1 K/uL    Assessment & Plan:   Problem List Items  Addressed This Visit     Healthcare maintenance - Primary (Chronic)   Preventative protocols reviewed and updated unless pt declined. Discussed healthy diet and lifestyle.       ED (erectile dysfunction)   Viagra  refilled       Relevant Medications   sildenafil  (VIAGRA ) 100 MG tablet   Eosinophilia   H/o this presumed allergy related - update CBC      Relevant Orders   CBC with Differential/Platelet   Other Visit Diagnoses       Special screening for malignant neoplasms, colon       Relevant Orders   Ambulatory referral to Gastroenterology     Encounter for lipid screening for cardiovascular disease       Relevant Orders   Lipid panel     Diabetes mellitus screening       Relevant Orders   Basic metabolic panel with GFR     Encounter for immunization       Relevant Orders   Flu vaccine trivalent PF, 6mos and older(Flulaval,Afluria,Fluarix,Fluzone) (Completed)        Meds ordered this encounter  Medications   methocarbamol  (ROBAXIN ) 500 MG tablet    Sig: Take 1 tablet (500 mg total) by mouth 2 (two) times daily as needed for muscle spasms (sedation precautions).    Dispense:  40 tablet    Refill:  1   sildenafil  (VIAGRA ) 100  MG tablet    Sig: Take 0.5-1 tablets (50-100 mg total) by mouth daily as needed for erectile dysfunction.    Dispense:  10 tablet    Refill:  11    Orders Placed This Encounter  Procedures   Flu vaccine trivalent PF, 6mos and older(Flulaval,Afluria,Fluarix,Fluzone)   Lipid panel   CBC with Differential/Platelet   Basic metabolic panel with GFR   Ambulatory referral to Gastroenterology    Referral Priority:   Routine    Referral Type:   Consultation    Referral Reason:   Specialty Services Required    Number of Visits Requested:   1    Patient Instructions  Flu shot today Labs today  We will refer you for colonoscopy  Return as needed or in 1-2 years for next physical  Follow up plan: Return in about 18 months (around 05/25/2026)  for annual exam, prior fasting for blood work.  Anton Blas, MD   "

## 2024-11-25 NOTE — Patient Instructions (Addendum)
 Flu shot today Labs today  We will refer you for colonoscopy  Return as needed or in 1-2 years for next physical

## 2024-11-25 NOTE — Assessment & Plan Note (Signed)
 Preventative protocols reviewed and updated unless pt declined. Discussed healthy diet and lifestyle.

## 2024-11-25 NOTE — Assessment & Plan Note (Signed)
 H/o this presumed allergy related - update CBC

## 2024-11-27 ENCOUNTER — Ambulatory Visit: Payer: Self-pay | Admitting: Family Medicine
